# Patient Record
Sex: Female | Born: 1990 | Race: White | Hispanic: No | Marital: Single | Smoking: Never smoker
Health system: Southern US, Community
[De-identification: ages and names within clinical notes are randomized; demographics above are authoritative.]

## PROBLEM LIST (undated history)

## (undated) ENCOUNTER — Inpatient Hospital Stay: Payer: Self-pay

## (undated) DIAGNOSIS — J45909 Unspecified asthma, uncomplicated: Secondary | ICD-10-CM

## (undated) DIAGNOSIS — F419 Anxiety disorder, unspecified: Secondary | ICD-10-CM

## (undated) DIAGNOSIS — Z87448 Personal history of other diseases of urinary system: Secondary | ICD-10-CM

## (undated) HISTORY — DX: Anxiety disorder, unspecified: F41.9

## (undated) HISTORY — PX: WISDOM TOOTH EXTRACTION: SHX21

---
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
## 2011-01-12 ENCOUNTER — Emergency Department: Payer: Self-pay | Admitting: Emergency Medicine

## 2012-08-03 ENCOUNTER — Emergency Department: Payer: Self-pay | Admitting: Emergency Medicine

## 2012-08-03 LAB — URINALYSIS, COMPLETE
Bilirubin,UR: NEGATIVE
Glucose,UR: NEGATIVE mg/dL (ref 0–75)
Nitrite: NEGATIVE
Protein: NEGATIVE
RBC,UR: 3 /HPF (ref 0–5)
Specific Gravity: 1.008 (ref 1.003–1.030)
Squamous Epithelial: 1
WBC UR: 13 /HPF (ref 0–5)

## 2012-08-03 LAB — COMPREHENSIVE METABOLIC PANEL
Albumin: 4.3 g/dL (ref 3.4–5.0)
Alkaline Phosphatase: 83 U/L (ref 50–136)
BUN: 9 mg/dL (ref 7–18)
Calcium, Total: 9.2 mg/dL (ref 8.5–10.1)
Co2: 32 mmol/L (ref 21–32)
Creatinine: 0.64 mg/dL (ref 0.60–1.30)
EGFR (African American): >60
EGFR (Non-African Amer.): >60
Glucose: 89 mg/dL (ref 65–99)
Osmolality: 276 (ref 275–301)
Potassium: 3.9 mmol/L (ref 3.5–5.1)
SGOT(AST): 14 U/L — ABNORMAL LOW (ref 15–37)
Sodium: 139 mmol/L (ref 136–145)
Total Protein: 8.2 g/dL (ref 6.4–8.2)

## 2012-08-03 LAB — LIPASE, BLOOD: Lipase: 178 U/L (ref 73–393)

## 2012-08-03 LAB — WET PREP, GENITAL

## 2012-08-03 LAB — CBC
HCT: 39.1 % (ref 35.0–47.0)
HGB: 13.2 g/dL (ref 12.0–16.0)
MCH: 27.6 pg (ref 26.0–34.0)
MCHC: 33.8 g/dL (ref 32.0–36.0)
Platelet: 362 10*3/uL (ref 150–440)
RDW: 15.6 % — ABNORMAL HIGH (ref 11.5–14.5)
WBC: 11 10*3/uL (ref 3.6–11.0)

## 2012-08-03 LAB — GC/CHLAMYDIA PROBE AMP

## 2015-07-03 ENCOUNTER — Encounter: Payer: Self-pay | Admitting: Emergency Medicine

## 2015-07-03 ENCOUNTER — Emergency Department: Payer: No Typology Code available for payment source

## 2015-07-03 ENCOUNTER — Emergency Department
Admission: EM | Admit: 2015-07-03 | Discharge: 2015-07-03 | Disposition: A | Discharge status: Home / Self Care | Payer: No Typology Code available for payment source | Attending: Emergency Medicine | Admitting: Emergency Medicine

## 2015-07-03 DIAGNOSIS — Y9389 Activity, other specified: Secondary | ICD-10-CM | POA: Insufficient documentation

## 2015-07-03 DIAGNOSIS — T22111A Burn of first degree of right forearm, initial encounter: Secondary | ICD-10-CM | POA: Diagnosis not present

## 2015-07-03 DIAGNOSIS — S161XXA Strain of muscle, fascia and tendon at neck level, initial encounter: Secondary | ICD-10-CM | POA: Diagnosis not present

## 2015-07-03 DIAGNOSIS — S40012A Contusion of left shoulder, initial encounter: Secondary | ICD-10-CM | POA: Diagnosis not present

## 2015-07-03 DIAGNOSIS — Y92411 Interstate highway as the place of occurrence of the external cause: Secondary | ICD-10-CM | POA: Insufficient documentation

## 2015-07-03 DIAGNOSIS — F17200 Nicotine dependence, unspecified, uncomplicated: Secondary | ICD-10-CM | POA: Insufficient documentation

## 2015-07-03 DIAGNOSIS — Y999 Unspecified external cause status: Secondary | ICD-10-CM | POA: Insufficient documentation

## 2015-07-03 DIAGNOSIS — S4992XA Unspecified injury of left shoulder and upper arm, initial encounter: Secondary | ICD-10-CM | POA: Diagnosis present

## 2015-07-03 MED ORDER — SILVER SULFADIAZINE 1 % EX CREA: TOPICAL_CREAM | CUTANEOUS | Status: DC

## 2015-07-03 MED ORDER — IBUPROFEN 800 MG PO TABS: 800.0000 mg | ORAL_TABLET | Freq: Three times a day (TID) | ORAL | Status: DC | PRN

## 2015-07-03 MED ORDER — IBUPROFEN 800 MG PO TABS
800.0000 mg | ORAL_TABLET | Freq: Once | ORAL | Status: AC
  Administered 2015-07-03: 800 mg via ORAL
  Filled 2015-07-03: qty 1

## 2015-07-03 MED ORDER — CYCLOBENZAPRINE HCL 5 MG PO TABS: 5.0000 mg | ORAL_TABLET | Freq: Three times a day (TID) | ORAL | Status: DC | PRN

## 2015-07-03 MED ORDER — CYCLOBENZAPRINE HCL 10 MG PO TABS
10.0000 mg | ORAL_TABLET | Freq: Once | ORAL | Status: AC
  Administered 2015-07-03: 10 mg via ORAL
  Filled 2015-07-03: qty 1
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
## 2015-07-03 NOTE — Discharge Instructions (Signed)
Cervical Sprain A cervical sprain is when the tissues (ligaments) that hold the neck bones in place stretch or tear. HOME CARE   Put ice on the injured area.  Put ice in a plastic bag.  Place a towel between your skin and the bag.  Leave the ice on for 15-20 minutes, 3-4 times a day.  You may have been given a collar to wear. This collar keeps your neck from moving while you heal.  Do not take the collar off unless told by your doctor.  If you have long hair, keep it outside of the collar.  Ask your doctor before changing the position of your collar. You may need to change its position over time to make it more comfortable.  If you are allowed to take off the collar for cleaning or bathing, follow your doctor's instructions on how to do it safely.  Keep your collar clean by wiping it with mild soap and water. Dry it completely. If the collar has removable pads, remove them every 1-2 days to hand wash them with soap and water. Allow them to air dry. They should be dry before you wear them in the collar.  Do not drive while wearing the collar.  Only take medicine as told by your doctor.  Keep all doctor visits as told.  Keep all physical therapy visits as told.  Adjust your work station so that you have good posture while you work.  Avoid positions and activities that make your problems worse.  Warm up and stretch before being active. GET HELP IF:  Your pain is not controlled with medicine.  You cannot take less pain medicine over time as planned.  Your activity level does not improve as expected. GET HELP RIGHT AWAY IF:   You are bleeding.  Your stomach is upset.  You have an allergic reaction to your medicine.  You develop new problems that you cannot explain.  You lose feeling (become numb) or you cannot move any part of your body (paralysis).  You have tingling or weakness in any part of your body.  Your symptoms get worse. Symptoms include:  Pain,  soreness, stiffness, puffiness (swelling), or a burning feeling in your neck.  Pain when your neck is touched.  Shoulder or upper back pain.  Limited ability to move your neck.  Headache.  Dizziness.  Your hands or arms feel week, lose feeling, or tingle.  Muscle spasms.  Difficulty swallowing or chewing. MAKE SURE YOU:   Understand these instructions.  Will watch your condition.  Will get help right away if you are not doing well or get worse.   This information is not intended to replace advice given to you by your health care provider. Make sure you discuss any questions you have with your health care provider.   Document Released: 07/13/2007 Document Revised: 09/26/2012 Document Reviewed: 08/01/2012 Elsevier Interactive Patient Education 2016 [employer].  Burn Care Your skin is a natural barrier to infection. It is the largest organ of your body. Burns damage this natural protection. To help prevent infection, it is very important to follow your caregiver's instructions in the care of your burn. Burns are classified as:  First degree. There is only redness of the skin (erythema). No scarring is expected.  Second degree. There is blistering of the skin. Scarring may occur with deeper burns.  Third degree. All layers of the skin are injured, and scarring is expected. HOME CARE INSTRUCTIONS   Wash your hands well  before changing your bandage.  Change your bandage as often as directed by your caregiver.  Remove the old bandage. If the bandage sticks, you may soak it off with cool, clean water.  Cleanse the burn thoroughly but gently with mild soap and water.  Pat the area dry with a clean, dry cloth.  Apply a thin layer of antibacterial cream to the burn.  Apply a clean bandage as instructed by your caregiver.  Keep the bandage as clean and dry as possible.  Elevate the affected area for the first 24 hours, then as instructed by your caregiver.  Only take  over-the-counter or prescription medicines for pain, discomfort, or fever as directed by your caregiver. SEEK IMMEDIATE MEDICAL CARE IF:   You develop excessive pain.  You develop redness, tenderness, swelling, or red streaks near the burn.  The burned area develops yellowish-white fluid (pus) or a bad smell.  You have a fever. MAKE SURE YOU:   Understand these instructions.  Will watch your condition.  Will get help right away if you are not doing well or get worse.   This information is not intended to replace advice given to you by your health care provider. Make sure you discuss any questions you have with your health care provider.   Document Released: 01/24/2005 Document Revised: 04/18/2011 Document Reviewed: 06/16/2010 Elsevier Interactive Patient Education 2016 [employer] Inc.  Contusion A contusion is a deep bruise. Contusions are the result of a blunt injury to tissues and muscle fibers under the skin. The injury causes bleeding under the skin. The skin overlying the contusion may turn blue, purple, or yellow. Minor injuries will give you a painless contusion, but more severe contusions may stay painful and swollen for a few weeks.  CAUSES  This condition is usually caused by a blow, trauma, or direct force to an area of the body. SYMPTOMS  Symptoms of this condition include:  Swelling of the injured area.  Pain and tenderness in the injured area.  Discoloration. The area may have redness and then turn blue, purple, or yellow. DIAGNOSIS  This condition is diagnosed based on a physical exam and medical history. An X-ray, CT scan, or MRI may be needed to determine if there are any associated injuries, such as broken bones (fractures). TREATMENT  Specific treatment for this condition depends on what area of the body was injured. In general, the best treatment for a contusion is resting, icing, applying pressure to (compression), and elevating the injured area. This is  often called the RICE strategy. Over-the-counter anti-inflammatory medicines may also be recommended for pain control.  HOME CARE INSTRUCTIONS   Rest the injured area.  If directed, apply ice to the injured area:  Put ice in a plastic bag.  Place a towel between your skin and the bag.  Leave the ice on for 20 minutes, 2-3 times per day.  If directed, apply light compression to the injured area using an elastic bandage. Make sure the bandage is not wrapped too tightly. Remove and reapply the bandage as directed by your health care provider.  If possible, raise (elevate) the injured area above the level of your heart while you are sitting or lying down.  Take over-the-counter and prescription medicines only as told by your health care provider. SEEK MEDICAL CARE IF:  Your symptoms do not improve after several days of treatment.  Your symptoms get worse.  You have difficulty moving the injured area. SEEK IMMEDIATE MEDICAL CARE IF:   You  have severe pain.  You have numbness in a hand or foot.  Your hand or foot turns pale or cold.   This information is not intended to replace advice given to you by your health care provider. Make sure you discuss any questions you have with your health care provider.   Document Released: 11/03/2004 Document Revised: 10/15/2014 Document Reviewed: 06/11/2014 Elsevier Interactive Patient Education 2016 Elsevier Inc.  Cryotherapy Cryotherapy means treatment with cold. Ice or gel packs can be used to reduce both pain and swelling. Ice is the most helpful within the first 24 to 48 hours after an injury or flare-up from overusing a muscle or joint. Sprains, strains, spasms, burning pain, shooting pain, and aches can all be eased with ice. Ice can also be used when recovering from surgery. Ice is effective, has very few side effects, and is safe for most people to use. PRECAUTIONS  Ice is not a safe treatment option for people with:  Raynaud  phenomenon. This is a condition affecting small blood vessels in the extremities. Exposure to cold may cause your problems to return.  Cold hypersensitivity. There are many forms of cold hypersensitivity, including:  Cold urticaria. Red, itchy hives appear on the skin when the tissues begin to warm after being iced.  Cold erythema. This is a red, itchy rash caused by exposure to cold.  Cold hemoglobinuria. Red blood cells break down when the tissues begin to warm after being iced. The hemoglobin that carry oxygen are passed into the urine because they cannot combine with blood proteins fast enough.  Numbness or altered sensitivity in the area being iced. If you have any of the following conditions, do not use ice until you have discussed cryotherapy with your caregiver:  Heart conditions, such as arrhythmia, angina, or chronic heart disease.  High blood pressure.  Healing wounds or open skin in the area being iced.  Current infections.  Rheumatoid arthritis.  Poor circulation.  Diabetes. Ice slows the blood flow in the region it is applied. This is beneficial when trying to stop inflamed tissues from spreading irritating chemicals to surrounding tissues. However, if you expose your skin to cold temperatures for too long or without the proper protection, you can damage your skin or nerves. Watch for signs of skin damage due to cold. HOME CARE INSTRUCTIONS Follow these tips to use ice and cold packs safely.  Place a dry or damp towel between the ice and skin. A damp towel will cool the skin more quickly, so you may need to shorten the time that the ice is used.  For a more rapid response, add gentle compression to the ice.  Ice for no more than 10 to 20 minutes at a time. The bonier the area you are icing, the less time it will take to get the benefits of ice.  Check your skin after 5 minutes to make sure there are no signs of a poor response to cold or skin damage.  Rest 20  minutes or more between uses.  Once your skin is numb, you can end your treatment. You can test numbness by very lightly touching your skin. The touch should be so light that you do not see the skin dimple from the pressure of your fingertip. When using ice, most people will feel these normal sensations in this order: cold, burning, aching, and numbness.  Do not use ice on someone who cannot communicate their responses to pain, such as small children or people with dementia.  HOW TO MAKE AN ICE PACK Ice packs are the most common way to use ice therapy. Other methods include ice massage, ice baths, and cryosprays. Muscle creams that cause a cold, tingly feeling do not offer the same benefits that ice offers and should not be used as a substitute unless recommended by your caregiver. To make an ice pack, do one of the following:  Place crushed ice or a bag of frozen vegetables in a sealable plastic bag. Squeeze out the excess air. Place this bag inside another plastic bag. Slide the bag into a pillowcase or place a damp towel between your skin and the bag.  Mix 3 parts water with 1 part rubbing alcohol. Freeze the mixture in a sealable plastic bag. When you remove the mixture from the freezer, it will be slushy. Squeeze out the excess air. Place this bag inside another plastic bag. Slide the bag into a pillowcase or place a damp towel between your skin and the bag. SEEK MEDICAL CARE IF:  You develop white spots on your skin. This may give the skin a blotchy (mottled) appearance.  Your skin turns blue or pale.  Your skin becomes waxy or hard.  Your swelling gets worse. MAKE SURE YOU:   Understand these instructions.  Will watch your condition.  Will get help right away if you are not doing well or get worse.   This information is not intended to replace advice given to you by your health care provider. Make sure you discuss any questions you have with your health care provider.   Document  Released: 09/20/2010 Document Revised: 02/14/2014 Document Reviewed: 09/20/2010 Elsevier Interactive Patient Education [employer] Elsevier Inc.
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
## 2016-01-26 ENCOUNTER — Emergency Department
Admission: EM | Admit: 2016-01-26 | Discharge: 2016-01-26 | Disposition: A | Discharge status: Home / Self Care | Payer: Self-pay | Attending: Emergency Medicine | Admitting: Emergency Medicine

## 2016-01-26 ENCOUNTER — Encounter: Payer: Self-pay | Admitting: Emergency Medicine

## 2016-01-26 DIAGNOSIS — J039 Acute tonsillitis, unspecified: Secondary | ICD-10-CM

## 2016-01-26 DIAGNOSIS — F17200 Nicotine dependence, unspecified, uncomplicated: Secondary | ICD-10-CM | POA: Insufficient documentation

## 2016-01-26 LAB — CBC WITH DIFFERENTIAL/PLATELET
BASOS ABS: 0 10*3/uL (ref 0–0.1)
BASOS PCT: 0 %
EOS ABS: 0.2 10*3/uL (ref 0–0.7)
Eosinophils Relative: 2 %
HEMATOCRIT: 40.1 % (ref 35.0–47.0)
HEMOGLOBIN: 13.9 g/dL (ref 12.0–16.0)
Lymphocytes Relative: 11 %
Lymphs Abs: 1.3 10*3/uL (ref 1.0–3.6)
MCH: 30.3 pg (ref 26.0–34.0)
MCHC: 34.6 g/dL (ref 32.0–36.0)
MCV: 87.6 fL (ref 80.0–100.0)
Monocytes Absolute: 0.6 10*3/uL (ref 0.2–0.9)
Monocytes Relative: 5 %
NEUTROS ABS: 10.1 10*3/uL — AB (ref 1.4–6.5)
NEUTROS PCT: 82 %
Platelets: 275 10*3/uL (ref 150–440)
RBC: 4.58 MIL/uL (ref 3.80–5.20)
RDW: 13 % (ref 11.5–14.5)
WBC: 12.2 10*3/uL — AB (ref 3.6–11.0)

## 2016-01-26 LAB — URINALYSIS, COMPLETE (UACMP) WITH MICROSCOPIC
BILIRUBIN URINE: NEGATIVE
Bacteria, UA: NONE SEEN
GLUCOSE, UA: NEGATIVE mg/dL
Hgb urine dipstick: NEGATIVE
KETONES UR: NEGATIVE mg/dL
Leukocytes, UA: NEGATIVE
NITRITE: NEGATIVE
PH: 9 — AB (ref 5.0–8.0)
Protein, ur: 30 mg/dL — AB
SPECIFIC GRAVITY, URINE: 1.018 (ref 1.005–1.030)

## 2016-01-26 LAB — POCT PREGNANCY, URINE: Preg Test, Ur: NEGATIVE

## 2016-01-26 LAB — POCT RAPID STREP A: STREPTOCOCCUS, GROUP A SCREEN (DIRECT): NEGATIVE

## 2016-01-26 LAB — MONONUCLEOSIS SCREEN: MONO SCREEN: NEGATIVE

## 2016-01-26 MED ORDER — AZITHROMYCIN 250 MG PO TABS: ORAL_TABLET | ORAL | 0 refills | Status: DC

## 2016-01-26 NOTE — Discharge Instructions (Signed)
Take medicine until completely finished beginning today. Take Tylenol or ibuprofen as needed for throat pain. Increase fluids. Follow-up with Dr. Jenne CampusMcQueen if any continued problems with your  throat.

## 2016-01-26 NOTE — ED Notes (Signed)
AAOx3.  Skin warm and dry. NAD.  No SOB/ DOE. 

## 2016-01-26 NOTE — ED Triage Notes (Signed)
Presents this am with sore throat and increased pain with swallowing

## 2016-01-26 NOTE — ED Provider Notes (Signed)
[facility] Emergency Department Provider Note  ____________________________________________   First MD Initiated Contact with Patient 01/26/16 1333     (approximate)  I have reviewed the triage vital signs and the nursing notes.   HISTORY  Chief Complaint Sore Throat   HPI Lindsey Deleon is a 25 y.o. female is here with complaints with throat that began this morning and has increased in pain with swallowing. Patient is unaware of any fever. She states she's not been exposed to strep throat as far she knows. Patient is not taking any over-the-counter medication prior to arrival in the emergency room. She rates her pain as 6/10.   History reviewed. No pertinent past medical history.  There are no active problems to display for this patient.   No past surgical history on file.  Prior to Admission medications   Medication Sig Start Date End Date Taking? Authorizing Provider  azithromycin (ZITHROMAX Z-PAK) 250 MG tablet Take 2 tablets (500 mg) on  Day 1,  followed by 1 tablet (250 mg) once daily on Days 2 through 5. 01/26/16   Tommi Rumpshonda L Summers, PA-C    Allergies Patient has no known allergies.  No family history on file.  Social History Social History  Substance Use Topics  . Smoking status: Current Every Day Smoker  . Smokeless tobacco: Never Used  . Alcohol use No    Review of Systems Constitutional: No fever/chills Eyes: No visual changes. ENT: Positive sore throat. Cardiovascular: Denies chest pain. Respiratory: Denies shortness of breath. Gastrointestinal: No abdominal pain.  No nausea, no vomiting.   Musculoskeletal: Negative for back pain. Skin: Negative for rash. Neurological: Negative for headaches, focal weakness or numbness.  10-point ROS otherwise negative.  ____________________________________________   PHYSICAL EXAM:  VITAL SIGNS: ED Triage Vitals  Enc Vitals Group     BP 01/26/16 1315 123/66     Pulse Rate  01/26/16 1315 78     Resp 01/26/16 1315 20     Temp 01/26/16 1315 98.4 F (36.9 C)     Temp Source 01/26/16 1315 Oral     SpO2 01/26/16 1315 100 %     Weight 01/26/16 1315 140 lb (63.5 kg)     Height 01/26/16 1315 5\' 4"  (1.626 m)     Head Circumference --      Peak Flow --      Pain Score 01/26/16 1259 6     Pain Loc --      Pain Edu? --      Excl. in GC? --     Constitutional: Alert and oriented. Well appearing and in no acute distress. Eyes: Conjunctivae are normal. PERRL. EOMI. Head: Atraumatic. Nose: No congestion/rhinnorhea. Mouth/Throat: Mucous membranes are moist.  Oropharynx With exudative tonsils bilaterally. Minimal erythema was noted. Neck: No stridor.  Hematological/Lymphatic/Immunilogical: No cervical lymphadenopathy. Cardiovascular: Normal rate, regular rhythm. Grossly normal heart sounds.  Good peripheral circulation. Respiratory: Normal respiratory effort.  No retractions. Lungs CTAB. Gastrointestinal: Soft and nontender. No distention.  Musculoskeletal: No lower extremity tenderness nor edema.  No joint effusions. Neurologic:  Normal speech and language. No gross focal neurologic deficits are appreciated. No gait instability. Skin:  Skin is warm, dry and intact. No rash noted. Psychiatric: Mood and affect are normal. Speech and behavior are normal.  ____________________________________________   LABS (all labs ordered are listed, but only abnormal results are displayed)  Labs Reviewed  CBC WITH DIFFERENTIAL/PLATELET - Abnormal; Notable for the following:  Result Value   WBC 12.2 (*)    Neutro Abs 10.1 (*)    All other components within normal limits  URINALYSIS, COMPLETE (UACMP) WITH MICROSCOPIC - Abnormal; Notable for the following:    Color, Urine YELLOW (*)    APPearance TURBID (*)    pH 9.0 (*)    Protein, ur 30 (*)    Squamous Epithelial / LPF 6-30 (*)    All other components within normal limits  CULTURE, GROUP A STREP [facility]    MONONUCLEOSIS SCREEN  POCT RAPID STREP A  POC URINE PREG, ED  POCT PREGNANCY, URINE   ____________________________________________  PROCEDURES  Procedure(s) performed: None  Procedures  Critical Care performed: No  ____________________________________________   INITIAL IMPRESSION / ASSESSMENT AND PLAN / ED COURSE  Pertinent labs & imaging results that were available during my care of the patient were reviewed by me and considered in my medical decision making (see chart for details).    Clinical Course    Patient was made aware that her strep test and mono were negative. Patient will be treated for exudative tonsillitis and she was given a prescription for Zithromax. She is also encouraged to take Tylenol or ibuprofen as needed for throat pain. She'll increase fluids. She'll follow up with Dr. Jenne CampusMcQueen if any continued problems with her throat.  ____________________________________________   FINAL CLINICAL IMPRESSION(S) / ED DIAGNOSES  Final diagnoses:  Exudative tonsillitis  Tonsillitis      NEW MEDICATIONS STARTED DURING THIS VISIT:  Discharge Medication List as of 01/26/2016  3:50 PM    START taking these medications   Details  azithromycin (ZITHROMAX Z-PAK) 250 MG tablet Take 2 tablets (500 mg) on  Day 1,  followed by 1 tablet (250 mg) once daily on Days 2 through 5., Print         Note:  This document was prepared using Dragon voice recognition software and may include unintentional dictation errors.    Tommi RumpsRhonda L Summers, PA-C 01/26/16 1906    Charlynne Panderavid Hsienta Yao, MD 01/28/16 [phone]

## 2016-01-27 LAB — CULTURE, GROUP A STREP (THRC)

## 2016-01-28 NOTE — Progress Notes (Addendum)
ED Antimicrobial Stewardship Positive Culture Follow Up   Lindsey Deleon is an 25 y.o. female who [facility] to [facility] with a chief complaint of  Chief Complaint  Patient presents with  . Sore Throat    Recent Results (from the past 720 hour(s))  Culture, group A strep     Status: None   Collection Time: 01/26/16 12:57 PM  Result Value Ref Range Status   Specimen Description THROAT  Final   Special Requests NONE  Final   Culture ABUNDANT GROUP A STREP (S.PYOGENES) ISOLATED  Final   Report Status 01/27/2016 FINAL  Final    Throat culture resulted growing group A strep. Patient was discharged on azithromycin which will likely cover. Per discussion with MD - will ask patient how they are feeling and if not improving will call in a prescription for amoxicillin.  New antibiotic prescription: amoxicillin 500 mg PO BID x 10 days  ED Provider: Dr. Don PerkingVeronese  12/21 - Called and left voicemail asking patient to return phone call to pharmacy.   Cindi CarbonMary M Reinhold Rickey, PharmD Clinical Pharmacist 01/28/2016, 3:16 PM  Addendum:  12/22 - 2nd attempt to contact patient. Left another voicemail requesting return phone call to [facility] pharmacy.  Cindi CarbonMary M Raimundo Corbit, PharmD Clinical Pharmacist 01/29/16 12:10 PM

## 2016-02-08 NOTE — L&D Delivery Note (Signed)
Delivery Note, + drainage of a Skene's gland cyst  At 10:40 PM a viable and healthy female "Lindsey Deleon" was delivered via Vaginal, Spontaneous Delivery (Presentation:ROA).  APGAR:8 ,9 ; weight 9 lb 2.7 oz (4160 g).   Placenta status: expressed.  Cord: 3VC with the following complications: none  Anesthesia:  epidural Episiotomy:  none Lacerations: 3rd degree Suture Repair: 2.0 3.0 vicryl vicryl rapide Est. Blood Loss (mL):  200  Mom to postpartum.  Baby to Couplet care / Skin to Skin.  Pt admitted from the office with a plan for iol for suspected LGA. She was contracting on arrival. With cytotec x1 buccally and foley bulb, then AROM for clear fluid and pitocin at a max of 8 mu/min, pt progressed to fully dilated. She pushed over an intact perineum with moderate pain control and excellent effort. She delivered the fetal head followed promptly by the fetal shoulders. The baby placed on maternal abdomen.  At full dilation, the skene's gland cyst was noted to be obstructing the introitus. This was cleaned with iodine and a 22 gauge needle used to withdrawal 4ml of cloudly white/yellow fluid to decompress for delivery.  Evaluation of the perineum found a 50% thickness sphincter 3rd degree tear.  Adequate anesthesia was assured prior to the start of the procedure. A multi-layer closure of the deep vaginal lac was planned.   The external sphincter was grasped with two long Allis clamps and brought to the midline. It was closed in an end-to-end fashion with 2-0 Vicryl in multiple interrupted stiches. It came together without excess tissues tension.  The rectovaginal septum and perineal body were brought together in running layers, adding in a crown stitch to bring the deep edges of the bulbocavernosus together in the midline.   The vaginal mucosa was repaired with 2-0 Vicryl Rapide in a running locked fashion, and the perineal edges were brought together in the midline in a subcuticular fashion.The last  knot was buried behind the hymen.   After conclusion of the repair, the skene's gland was cleaned with iodine again, and a 3mm incision made with a 10-blade and the gland fully drained. The incision was opened to try to allow for drainage and instructions about cleanliness given.  The patient tolerated the procedure well and is stable in the labor and delivery room.  Lindsey DouglasBethany Makinsley Deleon 10/13/2016, 11:25 PM

## 2016-09-22 ENCOUNTER — Other Ambulatory Visit: Payer: Self-pay | Admitting: Obstetrics and Gynecology

## 2016-09-22 DIAGNOSIS — Z3689 Encounter for other specified antenatal screening: Secondary | ICD-10-CM

## 2016-09-27 ENCOUNTER — Ambulatory Visit (INDEPENDENT_AMBULATORY_CARE_PROVIDER_SITE_OTHER): Payer: Medicaid Other | Admitting: Urology

## 2016-09-27 ENCOUNTER — Encounter: Payer: Self-pay | Admitting: Urology

## 2016-09-27 VITALS — BP 102/61 | HR 83 | Ht 64.0 in | Wt 172.2 lb

## 2016-09-27 DIAGNOSIS — N898 Other specified noninflammatory disorders of vagina: Secondary | ICD-10-CM | POA: Diagnosis not present

## 2016-09-27 LAB — URINALYSIS, COMPLETE
BILIRUBIN UA: NEGATIVE
Glucose, UA: NEGATIVE
Ketones, UA: NEGATIVE
Leukocytes, UA: NEGATIVE
NITRITE UA: NEGATIVE
PH UA: 7 (ref 5.0–7.5)
Protein, UA: NEGATIVE
RBC UA: NEGATIVE
Specific Gravity, UA: 1.015 (ref 1.005–1.030)
UUROB: 0.2 mg/dL (ref 0.2–1.0)

## 2016-09-27 LAB — MICROSCOPIC EXAMINATION
BACTERIA UA: NONE SEEN
RBC MICROSCOPIC, UA: NONE SEEN /HPF (ref 0–?)
WBC UA: NONE SEEN /HPF (ref 0–?)

## 2016-09-27 NOTE — Progress Notes (Signed)
09/27/2016 1:35 PM   Lindsey Deleon [birth year] [id number]  Referring provider: No referring provider defined for this encounter.  Chief Complaint  Patient presents with  . New Patient (Initial Visit)    Uretheral mass    HPI: 26 year old white female who is stable with IUP at 36 weeks referred over for urethral mass. She was seen by her obstetrician Dr. Feliberto Deleon 09/21/2016 and the mass was noted on exam. Pt thinks this was first noted a few months ago during early pregnancy. It is getting bigger, maybe pressure from the baby. Her UA was clear. She voids with a good flow. Feel empty. Pt can see it but she can't feel it and it doesn't bother her. She has frequency since being pregnant but none at baseline. No incontinence.  Looking back, Lindsey Deleon has no GU hx as a child nor any UTI. No GU surgery.    PMH: No past medical history on file.  Surgical History: No past surgical history on file.  Home Medications:  Allergies as of 09/27/2016   No Known Allergies     Medication List       Accurate as of 09/27/16  1:35 PM. Always use your most recent med list.          DOCOSAHEXAENOIC ACID PO Take by mouth.       Allergies: No Known Allergies  Family History: No family history on file.  Social History:  reports that she has been smoking.  She has never used smokeless tobacco. She reports that she does not drink alcohol. Her drug history is not on file.  ROS:                                        Physical Exam: There were no vitals taken for this visit.  Chaperone: Lindsey Deleon  Constitutional:  Alert and oriented, No acute distress. HEENT: [place] AT, moist mucus membranes.  Trachea midline, no masses. Cardiovascular: No clubbing, cyanosis, or edema. Respiratory: Normal respiratory effort, no increased work of breathing. GI: Abdomen is soft, nontender, nondistended, no abdominal masses GU: No CVA tenderness.  Pelvic: Vulva normal, there is a cystic  mass 5 o'clock just inferior to meatus at anterior vaginal wall. Meatus above looks normal. Pushing on the mass does not produce drainage or purulent fluid. Skin: No rashes, bruises or suspicious lesions. Lymph: No cervical or inguinal adenopathy. Neurologic: Grossly intact, no focal deficits, moving all 4 extremities. Psychiatric: Normal mood and affect.  Laboratory Data: Lab Results  Component Value Date   WBC 12.2 (H) 01/26/2016   HGB 13.9 01/26/2016   HCT 40.1 01/26/2016   MCV 87.6 01/26/2016   PLT 275 01/26/2016    Lab Results  Component Value Date   CREATININE 0.64 08/03/2012    No results found for: PSA  No results found for: TESTOSTERONE  No results found for: HGBA1C  Urinalysis    Component Value Date/Time   COLORURINE YELLOW (A) 01/26/2016 1418   APPEARANCEUR TURBID (A) 01/26/2016 1418   APPEARANCEUR Clear 08/03/2012 1220   LABSPEC 1.018 01/26/2016 1418   LABSPEC 1.008 08/03/2012 1220   PHURINE 9.0 (H) 01/26/2016 1418   GLUCOSEU NEGATIVE 01/26/2016 1418   GLUCOSEU Negative 08/03/2012 1220   HGBUR NEGATIVE 01/26/2016 1418   BILIRUBINUR NEGATIVE 01/26/2016 1418   BILIRUBINUR Negative 08/03/2012 1220   KETONESUR NEGATIVE 01/26/2016 1418   PROTEINUR 30 (A) 01/26/2016 1418  NITRITE NEGATIVE 01/26/2016 1418   LEUKOCYTESUR NEGATIVE 01/26/2016 1418   LEUKOCYTESUR 1+ 08/03/2012 1220     Assessment & Plan:  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 Will set up for pelvic MRI and consider TV US with a renal bladder US.   There are no diagnoses linked to this encounter.  No Follow-up on file.  Lindsey Field, MD  [facility] [address] [place], [place] [address] [phone]

## 2016-09-28 ENCOUNTER — Telehealth: Payer: Self-pay

## 2016-09-28 NOTE — Telephone Encounter (Signed)
Jerilee Field, MD  Skeet Latch, LPN        Please send today's note to referring provider in [facility] - thanks!    Notes were printed and faxed to Dr. Feliberto Gottron. Also made note to [facility] clinic BUA notes can be seen in EPIC.

## 2016-09-29 ENCOUNTER — Ambulatory Visit: Payer: No Typology Code available for payment source

## 2016-09-30 ENCOUNTER — Observation Stay
Admission: EM | Admit: 2016-09-30 | Discharge: 2016-09-30 | Disposition: A | Discharge status: Home / Self Care | Payer: Medicaid Other | Attending: Obstetrics and Gynecology | Admitting: Obstetrics and Gynecology

## 2016-09-30 ENCOUNTER — Ambulatory Visit
Admission: RE | Admit: 2016-09-30 | Discharge: 2016-09-30 | Disposition: A | Discharge status: Home / Self Care | Payer: Medicaid Other | Source: Ambulatory Visit | Attending: Urology | Admitting: Urology

## 2016-09-30 DIAGNOSIS — N898 Other specified noninflammatory disorders of vagina: Secondary | ICD-10-CM

## 2016-09-30 DIAGNOSIS — Z3A00 Weeks of gestation of pregnancy not specified: Secondary | ICD-10-CM | POA: Insufficient documentation

## 2016-09-30 DIAGNOSIS — Z3A38 38 weeks gestation of pregnancy: Secondary | ICD-10-CM | POA: Insufficient documentation

## 2016-09-30 DIAGNOSIS — R109 Unspecified abdominal pain: Secondary | ICD-10-CM | POA: Insufficient documentation

## 2016-09-30 DIAGNOSIS — Z87448 Personal history of other diseases of urinary system: Secondary | ICD-10-CM

## 2016-09-30 DIAGNOSIS — O26893 Other specified pregnancy related conditions, third trimester: Secondary | ICD-10-CM

## 2016-09-30 DIAGNOSIS — R0602 Shortness of breath: Secondary | ICD-10-CM | POA: Diagnosis not present

## 2016-09-30 DIAGNOSIS — Z3490 Encounter for supervision of normal pregnancy, unspecified, unspecified trimester: Secondary | ICD-10-CM

## 2016-09-30 HISTORY — DX: Personal history of other diseases of urinary system: Z87.448

## 2016-09-30 HISTORY — DX: Unspecified asthma, uncomplicated: J45.909

## 2016-09-30 MED ORDER — TERBUTALINE SULFATE 1 MG/ML IJ SOLN
INTRAMUSCULAR | Status: AC
  Filled 2016-09-30: qty 1

## 2016-09-30 MED ORDER — TERBUTALINE SULFATE 1 MG/ML IJ SOLN
0.2500 mg | Freq: Once | INTRAMUSCULAR | Status: AC
  Administered 2016-09-30: 0.25 mg via SUBCUTANEOUS

## 2016-09-30 NOTE — OB Triage Note (Signed)
Pt presents to [facility] with abdominal pain in the mid upper abdomen, shortness of breath and  "occasional cramping" in the mid lower abdomen beginning at 1600 today. She is feeling baby move, denies vaginal bleeding and LOF. VSS. Monitors applied and assessing.

## 2016-10-02 ENCOUNTER — Observation Stay
Admission: EM | Admit: 2016-10-02 | Discharge: 2016-10-02 | Disposition: A | Discharge status: Home / Self Care | Payer: Medicaid Other | Attending: Obstetrics and Gynecology | Admitting: Obstetrics and Gynecology

## 2016-10-02 ENCOUNTER — Encounter: Payer: Self-pay | Admitting: Obstetrics and Gynecology

## 2016-10-02 DIAGNOSIS — Z3A00 Weeks of gestation of pregnancy not specified: Secondary | ICD-10-CM | POA: Insufficient documentation

## 2016-10-02 DIAGNOSIS — O479 False labor, unspecified: Secondary | ICD-10-CM | POA: Diagnosis present

## 2016-10-02 DIAGNOSIS — Z3490 Encounter for supervision of normal pregnancy, unspecified, unspecified trimester: Secondary | ICD-10-CM

## 2016-10-02 NOTE — Progress Notes (Signed)
MD Schermerhorn notified via phone of pt presenting with c/o leaking of fluid, negative nitrazine, SVE, EFM reactive with no contractions. Order received for discharge, follow-up tomorrow with appointment.

## 2016-10-02 NOTE — Progress Notes (Signed)
Patient ID: Lindsey Deleon, female   DOB: Sep 06, 1990, 26 y.o.   MRN: [id number] Pt presented to L+D with rare ctx  cx check 1-2 cm .  Reassuring fetal monitoring  D/c home

## 2016-10-02 NOTE — Discharge Instructions (Signed)
Please call or return to [facility] with -Decreased fetal movement -Vaginal bleeding -Contractions every 5 mins for 1 hour -Leaking of fluid

## 2016-10-02 NOTE — Discharge Summary (Signed)
  Vinayak Bobier, Ihor Austin, MD  Obstetrics    [] Hide copied text [] Hover for attribution information Patient ID: Lindsey Deleon, female   DOB: 1990/10/07, 26 y.o.   MRN: [id number] Pt presented to L+D with rare ctx  cx check 1-2 cm .  Reassuring fetal monitoring  D/c home     Electronically signed by Suzy Bouchard, MD at 10/02/2016 9:42 AM

## 2016-10-02 NOTE — OB Triage Note (Signed)
Pt presents to L&D with c/o leaking clear fluid, "enough to wet underware but not through to pants." Pt denies vaginal bleeding, decreased fetal movement, and contractions. Pt with c/o intermittent upper abdominal pain. EFM and toco applied, explained to patient. Will monitor maternal and fetal well-being.

## 2016-10-03 ENCOUNTER — Ambulatory Visit: Payer: Medicaid Other | Admitting: Urology

## 2016-10-03 ENCOUNTER — Telehealth: Payer: Self-pay

## 2016-10-03 NOTE — Telephone Encounter (Signed)
Lindsey Field, MD  Skeet Latch, LPN        Notify patient her pelvic MRI showed a cyst near the urethra and it looks benign. Her OB could safely excise as planned from a GU point of view. I'll send Dr. Feliberto Gottron a note on my end.    Spoke with pt in reference to MRI results and having cyst excised. Pt stated that she was at the [facility] right now and will make them aware. Made pt aware BUA notes were faxed to Dr. Feliberto Gottron but could also be seen on care everywhere. Pt voiced understanding.

## 2016-10-04 NOTE — Progress Notes (Signed)
Patient ID: Lindsey Deleon, female   DOB: April 13, 1990, 26 y.o.   MRN: [id number] Pt c/o LOF  RN eval with negative nitrazine  No ctx Reassuring fetal monitoring  D/c home

## 2016-10-04 NOTE — Discharge Summary (Signed)
  [name], Ihor Austin, MD  Obstetrics    [] Hide copied text [] Hover for attribution information Patient ID: Lindsey Deleon, female   DOB: 13-Jun-1990, 26 y.o.   MRN: [id number] Pt c/o LOF  RN eval with negative nitrazine  No ctx Reassuring fetal monitoring  D/c home    Electronically signed by Suzy Bouchard, MD at 10/04/2016 8:26 AM

## 2016-10-08 ENCOUNTER — Observation Stay
Admission: EM | Admit: 2016-10-08 | Discharge: 2016-10-08 | Disposition: A | Discharge status: Home / Self Care | Payer: Medicaid Other | Attending: Obstetrics & Gynecology | Admitting: Obstetrics & Gynecology

## 2016-10-08 DIAGNOSIS — Z3A38 38 weeks gestation of pregnancy: Secondary | ICD-10-CM | POA: Diagnosis not present

## 2016-10-08 DIAGNOSIS — O471 False labor at or after 37 completed weeks of gestation: Secondary | ICD-10-CM | POA: Diagnosis present

## 2016-10-08 NOTE — Discharge Instructions (Signed)
Return to hospital for contractions that are closer together and stronger, for vaginal bleeding, leaking of fluid, or decreased fetal movement.

## 2016-10-08 NOTE — OB Triage Note (Signed)
Pt arrived in triage with c/o ctxs on and off for a couple weeks. Presents today with "some" contractions that are "not as strong" as they were last night. Denies recent LOF or VB. Reports some fetal movement but decreased recently. Discussed plan of care. Patient verbalize understanding.

## 2016-10-08 NOTE — OB Triage Note (Signed)
Discharge instructions provided and reviewed.  Follow-up care addressed.   All questions answered.

## 2016-10-10 NOTE — Discharge Summary (Signed)
Patient's last menstrual period was 01/19/2016. EDC: 10/08/2016 EGA: 8479w3d  Patient presented for evaluation of labor.  Patient had cervical exam by RN and this was reported to me. I reviewed her vital signs and fetal tracing, both of which were reassuring.  Patient was discharged as she was not laboring.  NST interpretation: Reactive.  Ranae Plumberhelsea Donnivan Villena, MD Attending Obstetrician and Gynecologist [facility]

## 2016-10-13 ENCOUNTER — Encounter: Payer: Self-pay | Admitting: *Deleted

## 2016-10-13 ENCOUNTER — Inpatient Hospital Stay: Payer: Medicaid Other | Admitting: Anesthesiology

## 2016-10-13 ENCOUNTER — Inpatient Hospital Stay
Admission: EM | Admit: 2016-10-13 | Discharge: 2016-10-15 | DRG: 775 | Disposition: A | Discharge status: Home / Self Care | Payer: Medicaid Other | Attending: Obstetrics and Gynecology | Admitting: Obstetrics and Gynecology

## 2016-10-13 DIAGNOSIS — N368 Other specified disorders of urethra: Secondary | ICD-10-CM | POA: Diagnosis present

## 2016-10-13 DIAGNOSIS — Z3A39 39 weeks gestation of pregnancy: Secondary | ICD-10-CM | POA: Diagnosis not present

## 2016-10-13 DIAGNOSIS — O3660X0 Maternal care for excessive fetal growth, unspecified trimester, not applicable or unspecified: Secondary | ICD-10-CM | POA: Diagnosis present

## 2016-10-13 DIAGNOSIS — O3663X0 Maternal care for excessive fetal growth, third trimester, not applicable or unspecified: Principal | ICD-10-CM | POA: Diagnosis present

## 2016-10-13 LAB — CBC
HEMATOCRIT: 35.3 % (ref 35.0–47.0)
Hemoglobin: 12.6 g/dL (ref 12.0–16.0)
MCH: 31.3 pg (ref 26.0–34.0)
MCHC: 35.7 g/dL (ref 32.0–36.0)
MCV: 87.8 fL (ref 80.0–100.0)
PLATELETS: 186 10*3/uL (ref 150–440)
RBC: 4.02 MIL/uL (ref 3.80–5.20)
RDW: 13.5 % (ref 11.5–14.5)
WBC: 8 10*3/uL (ref 3.6–11.0)

## 2016-10-13 LAB — TYPE AND SCREEN
ABO/RH(D): A POS
Antibody Screen: NEGATIVE

## 2016-10-13 LAB — URINE DRUG SCREEN, QUALITATIVE (ARMC ONLY)
AMPHETAMINES, UR SCREEN: NOT DETECTED
Barbiturates, Ur Screen: NOT DETECTED
Benzodiazepine, Ur Scrn: NOT DETECTED
CANNABINOID 50 NG, UR ~~LOC~~: NOT DETECTED
Cocaine Metabolite,Ur ~~LOC~~: NOT DETECTED
MDMA (ECSTASY) UR SCREEN: NOT DETECTED
Methadone Scn, Ur: NOT DETECTED
OPIATE, UR SCREEN: NOT DETECTED
Phencyclidine (PCP) Ur S: NOT DETECTED
Tricyclic, Ur Screen: NOT DETECTED

## 2016-10-13 MED ORDER — LACTATED RINGERS IV SOLN
INTRAVENOUS | Status: DC
  Administered 2016-10-13: 11:00:00 via INTRAVENOUS

## 2016-10-13 MED ORDER — LIDOCAINE HCL (PF) 2 % IJ SOLN
INTRAMUSCULAR | Status: DC | PRN
  Administered 2016-10-13: 5 mL via INTRADERMAL

## 2016-10-13 MED ORDER — LIDOCAINE HCL (PF) 1 % IJ SOLN: 30.0000 mL | INTRAMUSCULAR | Status: DC | PRN

## 2016-10-13 MED ORDER — OXYTOCIN BOLUS FROM INFUSION: 500.0000 mL | Freq: Once | INTRAVENOUS | Status: DC

## 2016-10-13 MED ORDER — PHENYLEPHRINE 40 MCG/ML (10ML) SYRINGE FOR IV PUSH (FOR BLOOD PRESSURE SUPPORT)
80.0000 ug | PREFILLED_SYRINGE | INTRAVENOUS | Status: DC | PRN
  Filled 2016-10-13: qty 5

## 2016-10-13 MED ORDER — SOD CITRATE-CITRIC ACID 500-334 MG/5ML PO SOLN
30.0000 mL | ORAL | Status: DC | PRN
  Administered 2016-10-13: 30 mL via ORAL
  Filled 2016-10-13: qty 15

## 2016-10-13 MED ORDER — LACTATED RINGERS IV SOLN: 500.0000 mL | Freq: Once | INTRAVENOUS | Status: DC

## 2016-10-13 MED ORDER — LACTATED RINGERS IV SOLN
500.0000 mL | INTRAVENOUS | Status: DC | PRN
  Administered 2016-10-13: 500 mL via INTRAVENOUS

## 2016-10-13 MED ORDER — BENZOCAINE-MENTHOL 20-0.5 % EX AERO
1.0000 "application " | INHALATION_SPRAY | CUTANEOUS | Status: DC | PRN
  Administered 2016-10-14: 1 via TOPICAL
  Filled 2016-10-13: qty 56

## 2016-10-13 MED ORDER — FENTANYL 2.5 MCG/ML W/ROPIVACAINE 0.15% IN NS 100 ML EPIDURAL (ARMC)
EPIDURAL | Status: AC
  Filled 2016-10-13: qty 100

## 2016-10-13 MED ORDER — BUPIVACAINE HCL (PF) 0.25 % IJ SOLN
INTRAMUSCULAR | Status: DC | PRN
  Administered 2016-10-13: 10 mL via EPIDURAL

## 2016-10-13 MED ORDER — OXYTOCIN 40 UNITS IN LACTATED RINGERS INFUSION - SIMPLE MED: 2.5000 [IU]/h | INTRAVENOUS | Status: DC

## 2016-10-13 MED ORDER — IBUPROFEN 600 MG PO TABS
600.0000 mg | ORAL_TABLET | Freq: Four times a day (QID) | ORAL | Status: DC
  Administered 2016-10-14 – 2016-10-15 (×8): 600 mg via ORAL
  Filled 2016-10-13 (×8): qty 1

## 2016-10-13 MED ORDER — FENTANYL 2.5 MCG/ML W/ROPIVACAINE 0.15% IN NS 100 ML EPIDURAL (ARMC)
12.0000 mL/h | EPIDURAL | Status: DC
  Administered 2016-10-13: 12 mL/h via EPIDURAL

## 2016-10-13 MED ORDER — EPHEDRINE 5 MG/ML INJ
10.0000 mg | INTRAVENOUS | Status: DC | PRN
  Filled 2016-10-13: qty 2

## 2016-10-13 MED ORDER — DIPHENHYDRAMINE HCL 50 MG/ML IJ SOLN: 12.5000 mg | INTRAMUSCULAR | Status: DC | PRN

## 2016-10-13 MED ORDER — AMPICILLIN SODIUM 2 G IJ SOLR: 2.0000 g | Freq: Once | INTRAMUSCULAR | Status: DC

## 2016-10-13 MED ORDER — TERBUTALINE SULFATE 1 MG/ML IJ SOLN: 0.2500 mg | Freq: Once | INTRAMUSCULAR | Status: DC | PRN

## 2016-10-13 MED ORDER — OXYTOCIN 40 UNITS IN LACTATED RINGERS INFUSION - SIMPLE MED
1.0000 m[IU]/min | INTRAVENOUS | Status: DC
  Administered 2016-10-13: 2 m[IU]/min via INTRAVENOUS
  Filled 2016-10-13: qty 1000

## 2016-10-13 MED ORDER — LIDOCAINE-EPINEPHRINE (PF) 1.5 %-1:200000 IJ SOLN
INTRAMUSCULAR | Status: DC | PRN
  Administered 2016-10-13: 3 mL via PERINEURAL

## 2016-10-13 MED ORDER — PHENYLEPHRINE 40 MCG/ML (10ML) SYRINGE FOR IV PUSH (FOR BLOOD PRESSURE SUPPORT)
80.0000 ug | PREFILLED_SYRINGE | INTRAVENOUS | Status: DC | PRN
Start: 2016-10-13 — End: 2016-10-15
  Filled 2016-10-13: qty 5

## 2016-10-13 MED ORDER — BUTORPHANOL TARTRATE 1 MG/ML IJ SOLN: 1.0000 mg | INTRAMUSCULAR | Status: DC | PRN

## 2016-10-13 MED ORDER — ACETAMINOPHEN 325 MG PO TABS
650.0000 mg | ORAL_TABLET | ORAL | Status: DC | PRN
  Filled 2016-10-13 (×2): qty 2

## 2016-10-13 MED ORDER — MISOPROSTOL 25 MCG QUARTER TABLET
25.0000 ug | ORAL_TABLET | ORAL | Status: DC | PRN
  Administered 2016-10-13: 25 ug via BUCCAL
  Filled 2016-10-13: qty 1

## 2016-10-13 MED ORDER — LIDOCAINE HCL (PF) 1 % IJ SOLN
INTRAMUSCULAR | Status: DC | PRN
  Administered 2016-10-13: 3 mL

## 2016-10-13 MED ORDER — ONDANSETRON HCL 4 MG/2ML IJ SOLN: 4.0000 mg | Freq: Four times a day (QID) | INTRAMUSCULAR | Status: DC | PRN

## 2016-10-13 NOTE — Anesthesia Preprocedure Evaluation (Signed)
Anesthesia Evaluation  Patient identified by MRN, date of birth, ID band Patient awake    Reviewed: Allergy & Precautions, NPO status , Patient's Chart, lab work & pertinent test results  Airway Mallampati: II  TM Distance: >3 FB     Dental   Pulmonary asthma ,    Pulmonary exam normal        Cardiovascular negative cardio ROS Normal cardiovascular exam     Neuro/Psych negative neurological ROS  negative psych ROS   GI/Hepatic negative GI ROS, Neg liver ROS,   Endo/Other  negative endocrine ROS  Renal/GU negative Renal ROS  negative genitourinary   Musculoskeletal negative musculoskeletal ROS (+)   Abdominal Normal abdominal exam  (+)   Peds negative pediatric ROS (+)  Hematology negative hematology ROS (+)   Anesthesia Other Findings   Reproductive/Obstetrics (+) Pregnancy                             Anesthesia Physical Anesthesia Plan  ASA: II  Anesthesia Plan: Epidural   Post-op Pain Management:    Induction:   PONV Risk Score and Plan:   Airway Management Planned: Natural Airway  Additional Equipment:   Intra-op Plan:   Post-operative Plan:   Informed Consent: I have reviewed the patients History and Physical, chart, labs and discussed the procedure including the risks, benefits and alternatives for the proposed anesthesia with the patient or authorized representative who has indicated his/her understanding and acceptance.   Dental advisory given  Plan Discussed with: CRNA and Surgeon  Anesthesia Plan Comments:         Anesthesia Quick Evaluation

## 2016-10-13 NOTE — H&P (Addendum)
OB ADMISSION/ HISTORY & PHYSICAL:  Admission Date: 10/13/2016 10:18 AM  Admit Diagnosis: Elective induction of labor for suspected LGA baby  Lindsey Deleon is a 26 y.o. female presenting for iol.  Prenatal History: G1P0   EDC : 10/19/2016, by Other Basis  [facility] Prenatal course complicated by  - small subchorionic hemorrgae in early pregnancy - THC early in pregnancy - LGA: EFW today in office 4353g, 9#10oz, vtx. AFI 20cm - Left Skene's gland cyst, evaluated by urology and MRI  Prenatal Labs: MBT A+ Ab screen Neg Pap HIV neg Neg Hep B/RPR Neg/NR, GC/C Neg  Rubella Immune VZV Immune GBS:   negative  Medical / Surgical History :  Past medical history:  Past Medical History:  Diagnosis Date  . Asthma    childhood asthma  . History of cyst of urethra 09/30/2016   current cyst     Past surgical history:  Past Surgical History:  Procedure Laterality Date  . WISDOM TOOTH EXTRACTION      Family History:  Family History  Problem Relation Age of Onset  . Bladder Cancer Neg Hx   . Kidney cancer Neg Hx      Social History:  reports that she has never smoked. She has never used smokeless tobacco. She reports that she does not drink alcohol or use drugs.   Allergies: Patient has no known allergies.    Current Medications at time of admission:  Prior to Admission medications   Medication Sig Start Date End Date Taking? Authorizing Provider  Prenatal Multivit-Min-Fe-FA (PRENATAL VITAMINS PO) Take by mouth.   Yes [provider]     Review of Systems: Active FM Contractions q4 min, not feeling them   Physical Exam:  VS: Blood pressure 119/74, pulse 94, temperature 98 F (36.7 C), temperature source Oral, resp. rate 18, height 5\' 5"  (1.651 m), weight 174 lb (78.9 kg), last menstrual period 01/19/2016.  General: alert and oriented, appears nad Heart: RRR Lungs: Clear lung fields Abdomen: Gravid, soft and non-tender, non-distended / uterus: non  tender Extremities: no edema  Genitalia / VE:  2/50/high  Assessment: 39+[redacted] weeks gestation FHR category 1   Plan:   Admit for induction of labor Labs pending Epidural when desired Continuous fetal monitoring   1. Fetal Well being  - Fetal Tracing: cat I - Ultrasound:  EFW 4300 - Group B Streptococcus: neg - Presentation: vtx confirmed by ultrasound   2. Routine OB: - Prenatal labs reviewed, as above - Rh postitive  3. Induction of Labor:  -  Contractions external toco in place -  Plan for induction with cytotec at 11:45, and FB placed just now  4. Post Partum Planning: - Infant feeding: breast

## 2016-10-13 NOTE — Progress Notes (Signed)
Lindsey Deleon is a 26 y.o. G1P0 at 4130w1d iol for LGA - now on 498mu/pit - feeling some contractions  Objective: BP 114/61   Pulse 75   Temp 98.4 F (36.9 C) (Oral)   Resp 16   Ht 5\' 5"  (1.651 m)   Wt 174 lb (78.9 kg)   LMP 01/19/2016   BMI 28.96 kg/m  No intake/output data recorded. No intake/output data recorded.  FHT:  FHR: 140 bpm, variability: moderate,  accelerations:  Present,  decelerations:  Absent UC:   regular, every 2-3 minutes SVE:   Dilation: 5 Effacement (%): 60 Station: -3 Exam by:: B.Darionna Banke, MD  Labs: Lab Results  Component Value Date   WBC 8.0 10/13/2016   HGB 12.6 10/13/2016   HCT 35.3 10/13/2016   MCV 87.8 10/13/2016   PLT 186 10/13/2016    Assessment / Plan: Augmentation of labor, progressing well  AROM for copious clear fluid Internal fetal monitoring placed Cat I strip Epidural when desired  Lindsey Deleon 10/13/2016, 6:39 PM

## 2016-10-13 NOTE — Progress Notes (Signed)
Lindsey Deleon is a [age] y.o. G1P0 at 8455w1d  Subjective: Comfortable, not feeling contractions  Objective: BP (!) 97/57 (BP Location: Right Arm)   Pulse 83   Temp 98.4 F (36.9 C) (Oral)   Resp 17   Ht 5\' 5"  (1.651 m)   Wt 174 lb (78.9 kg)   LMP 01/19/2016   BMI 28.96 kg/m  No intake/output data recorded. No intake/output data recorded.  FHT:  FHR: 120 bpm, variability: moderate,  accelerations:  Present,  decelerations:  Absent UC:   irregular, every 3 minutes SVE:   Dilation: 4 Effacement (%): 50 Exam by:: Lindsey ManifoldB. Lashawnna Lambrecht, MD  Labs: Lab Results  Component Value Date   WBC 8.0 10/13/2016   HGB 12.6 10/13/2016   HCT 35.3 10/13/2016   MCV 87.8 10/13/2016   PLT 186 10/13/2016    Assessment / Plan: Cephalic confirmed by bedside u/s Foley bulb out Wills tart picocin now, 4 hrs from last cytotec Anticipated MOD:  NSVD  Christeen DouglasBethany Caralina Nop 10/13/2016, 4:05 PM

## 2016-10-13 NOTE — Discharge Summary (Signed)
Obstetrical Discharge Summary  Patient Name: Lindsey Deleon DOB: 1990-11-02 MRN: [id number]  Date of Admission: 10/13/2016 Date of Discharge: 10/15/2016  [facility]: [facility]  Gestational Age at Delivery: [redacted]w[redacted]d   Antepartum complications:  - small subchorionic hemorrgae in early pregnancy - THC early in pregnancy - LGA: EFW today in office 4353g, 9#10oz, vtx. AFI 20cm - Left Skene's gland cyst, evaluated by urology and MRI  Admitting Diagnosis: LGA Secondary Diagnosis: Patient Active Problem List   Diagnosis Date Noted  . Large for gestational age fetus affecting management of mother 10/13/2016  . Labor and delivery indication for care or intervention 10/08/2016  . Pregnancy 09/30/2016    Augmentation: AROM, Pitocin, Cytotec and Foley Balloon Complications: None Intrapartum complications/course:  NSVD after induction Drainage of left skene's glad Repair of 3rd deg laceration  Date of Delivery: 10/13/16 Delivered By: Lindsey Deleon Delivery Type: spontaneous vaginal delivery Anesthesia: epidural Placenta: Spontaneous Laceration: 3rd deg Episiotomy: none Newborn Data: Live born female "Lindsey Deleon" Birth Weight: 9 lb 2.7 oz (4160 g) APGAR:8 , 9    Discharge Physical Exam:  BP 111/63 (BP Location: Left Arm)   Pulse 74   Temp 98.7 F (37.1 C) (Oral)   Resp 20   Ht  (1.651 m)   Wt 174 lb (78.9 kg)   LMP 01/19/2016   SpO2 99%   Breastfeeding? Unknown   BMI 28.96 kg/m   General: NAD CV: RRR Pulm: CTABL, nl effort ABD: s/nd/nt, fundus firm and below the umbilicus Lochia: moderate Incision: c/d/i  DVT Evaluation: LE non-ttp, no evidence of DVT on exam.  Hemoglobin  Date Value Ref Range Status  10/14/2016 11.7 (L) 12.0 - 16.0 g/dL Final   HGB  Date Value Ref Range Status  08/03/2012 13.2 12.0 - 16.0 g/dL Final   HCT  Date Value Ref Range Status  10/14/2016 33.4 (L) 35.0 - 47.0 % Final  08/03/2012 39.1 35.0 - 47.0 % Final    Post partum  course: Uncomplicated Postpartum Procedures: none Disposition: stable, discharge to home. Baby Feeding: breastmilk and formula Baby Disposition: home with mom  Rh Immune globulin given: n/a Rubella vaccine given: n/a Prenatal Labs:   A pos, HIV neg, HEB B neg, RPR, NR, RI VI, GBS neg  Plan:  Lindsey [place]on was discharged to home in good condition. Follow-up appointment at [facility] in 2 weeks for eval of skene's gland    Discharge Medications: Allergies as of 10/15/2016   No Known Allergies     Medication List    TAKE these medications   docusate sodium 100 MG capsule Commonly known as:  COLACE Take 1 capsule (100 mg total) by mouth 2 (two) times daily. To keep stools soft, as needed   ferrous sulfate 325 (65 FE) MG tablet Take 1 tablet (325 mg total) by mouth daily with breakfast. Take with Vitamin C   ibuprofen 800 MG tablet Commonly known as:  ADVIL,MOTRIN Take 1 tablet (800 mg total) by mouth every 8 (eight) hours as needed for moderate pain or cramping.   oxyCODONE-acetaminophen 5-325 MG tablet Commonly known as:  PERCOCET/ROXICET Take 1-2 tablets by mouth every 6 (six) hours as needed for severe pain.   PRENATAL VITAMINS PO Take by mouth.            Discharge Care Instructions        Start     Ordered   10/15/16 0000  docusate sodium (COLACE) 100 MG capsule  2 times daily  10/15/16 1622   10/15/16 0000  ferrous sulfate 325 (65 FE) MG tablet  Daily with breakfast     10/15/16 1622   10/15/16 0000  ibuprofen (ADVIL,MOTRIN) 800 MG tablet  Every 8 hours PRN     10/15/16 1622   10/15/16 0000  oxyCODONE-acetaminophen (PERCOCET/ROXICET) 5-325 MG tablet  Every 6 hours PRN     10/15/16 1622      Follow-up Information    Lindsey DouglasBeasley, Lindsey Carrender, MD Follow up in 2 week(s).   Specialty:  Obstetrics and Gynecology Why:  Call to make follow up appointment. But if you have concerns, call earlier. There is lots of breastfeeding support we can get you  to. Contact information: [address] [place] [address] [phone]           Signed: Christeen DouglasBethany Shahan [place] 10/15/16

## 2016-10-13 NOTE — Anesthesia Procedure Notes (Signed)
Epidural Patient location during procedure: OB Start time: 10/13/2016 7:18 PM End time: 10/13/2016 7:22 PM  Staffing Anesthesiologist: Yves DillARROLL, Hanan Moen Performed: anesthesiologist   Preanesthetic Checklist Completed: patient identified, site marked, surgical consent, pre-op evaluation, timeout performed, IV checked, risks and benefits discussed and monitors and equipment checked  Epidural Patient position: sitting Prep: Betadine Patient monitoring: heart rate, continuous pulse ox and blood pressure Approach: midline Location: L3-L4 Injection technique: LOR air  Needle:  Needle type: Tuohy  Needle gauge: 17 G Needle length: 9 cm and 9 Catheter type: closed end flexible Catheter size: 19 Gauge Test dose: negative and 1.5% lidocaine with Epi 1:200 K  Assessment Events: blood not aspirated, injection not painful, no injection resistance, negative IV test and no paresthesia  Additional Notes Time out called.  Patient placed in sitting position.  Back prepped and draped in sterile fashion.  A skin wheal was made in the L3-L4 interspace with 1% Lidocaine plain.  A 17G Tuohy needle was advanced to the epidural space by a loss of resistance technique.  The epidural catheter was threaded 3cm into the space and the TD was negative.  No blood or paresthesias.  The catheter was affixed to the back in sterile fashion.  The patient tolerated the procedure well.Reason for block:procedure for pain

## 2016-10-13 NOTE — Progress Notes (Signed)
Patient ID: Lindsey [place], female   DOB: 07-09-1990, 26 y.o.   MRN: [id number]  Pt fully dilated, feeling the urge to push. Epidural working well.  SVE: 10/100/+2 Toco: q2 min, pitocin at 8 mu/min FHT: Cat II  A/P: 25yo G1P0 at 39+1wks, suspected LGA - continue pushing with attention to labor curve - stool at bedside - excellent pushing efforts

## 2016-10-14 LAB — CBC
HCT: 33.4 % — ABNORMAL LOW (ref 35.0–47.0)
HEMOGLOBIN: 11.7 g/dL — AB (ref 12.0–16.0)
MCH: 30.9 pg (ref 26.0–34.0)
MCHC: 35.1 g/dL (ref 32.0–36.0)
MCV: 88 fL (ref 80.0–100.0)
PLATELETS: 190 10*3/uL (ref 150–440)
RBC: 3.8 MIL/uL (ref 3.80–5.20)
RDW: 13.3 % (ref 11.5–14.5)
WBC: 14.9 10*3/uL — ABNORMAL HIGH (ref 3.6–11.0)

## 2016-10-14 LAB — RPR: RPR: NONREACTIVE

## 2016-10-14 MED ORDER — TETANUS-DIPHTH-ACELL PERTUSSIS 5-2.5-18.5 LF-MCG/0.5 IM SUSP: 0.5000 mL | Freq: Once | INTRAMUSCULAR | Status: DC

## 2016-10-14 MED ORDER — ONDANSETRON HCL 4 MG PO TABS: 4.0000 mg | ORAL_TABLET | ORAL | Status: DC | PRN

## 2016-10-14 MED ORDER — PRENATAL MULTIVITAMIN CH
1.0000 | ORAL_TABLET | Freq: Every day | ORAL | Status: DC
  Administered 2016-10-14 – 2016-10-15 (×2): 1 via ORAL
  Filled 2016-10-14 (×2): qty 1

## 2016-10-14 MED ORDER — WITCH HAZEL-GLYCERIN EX PADS
1.0000 "application " | MEDICATED_PAD | CUTANEOUS | Status: DC
  Administered 2016-10-14: 1 via TOPICAL
  Filled 2016-10-14: qty 100

## 2016-10-14 MED ORDER — DIBUCAINE 1 % RE OINT
1.0000 "application " | TOPICAL_OINTMENT | RECTAL | Status: DC | PRN
  Administered 2016-10-14: 1 via RECTAL
  Filled 2016-10-14: qty 28

## 2016-10-14 MED ORDER — BISACODYL 10 MG RE SUPP: 10.0000 mg | Freq: Every day | RECTAL | Status: DC | PRN

## 2016-10-14 MED ORDER — SODIUM CHLORIDE 0.9% FLUSH: 3.0000 mL | INTRAVENOUS | Status: DC | PRN

## 2016-10-14 MED ORDER — SODIUM CHLORIDE 0.9% FLUSH: 3.0000 mL | Freq: Two times a day (BID) | INTRAVENOUS | Status: DC

## 2016-10-14 MED ORDER — FLEET ENEMA 7-19 GM/118ML RE ENEM: 1.0000 | ENEMA | Freq: Every day | RECTAL | Status: DC | PRN

## 2016-10-14 MED ORDER — DIPHENHYDRAMINE HCL 25 MG PO CAPS: 25.0000 mg | ORAL_CAPSULE | Freq: Four times a day (QID) | ORAL | Status: DC | PRN

## 2016-10-14 MED ORDER — SENNOSIDES-DOCUSATE SODIUM 8.6-50 MG PO TABS
2.0000 | ORAL_TABLET | ORAL | Status: DC
  Administered 2016-10-15: 2 via ORAL
  Filled 2016-10-14: qty 2

## 2016-10-14 MED ORDER — DIBUCAINE 1 % RE OINT: 1.0000 "application " | TOPICAL_OINTMENT | RECTAL | Status: DC | PRN

## 2016-10-14 MED ORDER — WITCH HAZEL-GLYCERIN EX PADS: 1.0000 "application " | MEDICATED_PAD | CUTANEOUS | Status: DC | PRN

## 2016-10-14 MED ORDER — COCONUT OIL OIL
1.0000 "application " | TOPICAL_OIL | Status: DC | PRN
  Administered 2016-10-15: 1 via TOPICAL
  Filled 2016-10-14: qty 120

## 2016-10-14 MED ORDER — ACETAMINOPHEN 325 MG PO TABS
650.0000 mg | ORAL_TABLET | ORAL | Status: DC | PRN
  Administered 2016-10-14: 650 mg via ORAL

## 2016-10-14 MED ORDER — HYDROCORTISONE 2.5 % RE CREA
TOPICAL_CREAM | RECTAL | Status: DC
  Administered 2016-10-14: 18:00:00 via RECTAL
  Filled 2016-10-14: qty 28.35

## 2016-10-14 MED ORDER — ONDANSETRON HCL 4 MG/2ML IJ SOLN: 4.0000 mg | INTRAMUSCULAR | Status: DC | PRN

## 2016-10-14 MED ORDER — MEASLES, MUMPS & RUBELLA VAC ~~LOC~~ INJ: 0.5000 mL | INJECTION | Freq: Once | SUBCUTANEOUS | Status: DC

## 2016-10-14 MED ORDER — SODIUM CHLORIDE 0.9 % IV SOLN: 250.0000 mL | INTRAVENOUS | Status: DC | PRN

## 2016-10-14 MED ORDER — ZOLPIDEM TARTRATE 5 MG PO TABS: 5.0000 mg | ORAL_TABLET | Freq: Every evening | ORAL | Status: DC | PRN

## 2016-10-14 MED ORDER — SIMETHICONE 80 MG PO CHEW: 80.0000 mg | CHEWABLE_TABLET | ORAL | Status: DC | PRN

## 2016-10-14 MED ORDER — OXYCODONE HCL 5 MG PO TABS
5.0000 mg | ORAL_TABLET | ORAL | Status: DC | PRN
Start: 2016-10-14 — End: 2016-10-15
  Administered 2016-10-14 – 2016-10-15 (×7): 5 mg via ORAL
  Filled 2016-10-14 (×7): qty 1

## 2016-10-14 NOTE — Anesthesia Postprocedure Evaluation (Signed)
Anesthesia Post Note  Patient: Lindsey Deleon  Procedure(s) Performed: * No procedures listed *  Patient location during evaluation: [facility] Anesthesia Type: Epidural Level of consciousness: awake and awake and alert Pain management: pain level controlled Vital Signs Assessment: post-procedure vital signs reviewed and stable Respiratory status: spontaneous breathing Cardiovascular status: blood pressure returned to baseline Postop Assessment: no headache Anesthetic complications: no     Last Vitals:  Vitals:   10/14/16 0618 10/14/16 0730  BP: (!) 107/54 119/67  Pulse: 78 67  Resp: 16 18  Temp: 36.6 C 36.7 C  SpO2: 99% 99%    Last Pain:  Vitals:   10/14/16 0730  TempSrc: Oral  PainSc:                  Vernie MurdersPope,  Raelea Gosse G

## 2016-10-14 NOTE — Progress Notes (Signed)
Post Partum Day 1 Subjective: Doing well, no complaints.  Tolerating regular diet, pain with PO meds, voiding and ambulating without difficulty.  No CP SOB F/C N/V or leg pain   Objective: BP (!) 117/58 (BP Location: Left Arm)   Pulse 66   Temp 97.7 F (36.5 C) (Oral)   Resp 17   Ht 5\' 5"  (1.651 m)   Wt 78.9 kg (174 lb)   LMP 01/19/2016   SpO2 99%    BMI 28.96 kg/m    Physical Exam:  General: NAD CV: RRR Pulm: nl effort, CTABL Lochia: moderate Uterine Fundus: fundus firm and below umbilicus DVT Evaluation: no cords, ttp LEs    Recent Labs  10/13/16 1108 10/14/16 0558  HGB 12.6 11.7*  HCT 35.3 33.4*  WBC 8.0 14.9*  PLT 186 190    Assessment/Plan: 25 y.o. G1P1001 postpartum day # 1  1. Routine post partum care 2. Perineal care: add witch hazel pads and hydrocortisone cream 3. Anticipated d/c tomorrow  ----- Ranae Plumberhelsea Michaelle Bottomley, MD Attending Obstetrician and Gynecologist [facility], Department of OB/GYN [facility]

## 2016-10-15 MED ORDER — DOCUSATE SODIUM 100 MG PO CAPS: 100.0000 mg | ORAL_CAPSULE | Freq: Two times a day (BID) | ORAL | 0 refills | Status: AC

## 2016-10-15 MED ORDER — IBUPROFEN 800 MG PO TABS: 800.0000 mg | ORAL_TABLET | Freq: Three times a day (TID) | ORAL | 1 refills | Status: DC | PRN

## 2016-10-15 MED ORDER — FERROUS SULFATE 325 (65 FE) MG PO TABS: 325.0000 mg | ORAL_TABLET | Freq: Every day | ORAL | 1 refills | Status: DC

## 2016-10-15 MED ORDER — OXYCODONE-ACETAMINOPHEN 5-325 MG PO TABS: 1.0000 | ORAL_TABLET | Freq: Four times a day (QID) | ORAL | 0 refills | Status: DC | PRN

## 2016-10-15 NOTE — Anesthesia Postprocedure Evaluation (Deleted)
Anesthesia Post Note  Patient: Lindsey AyeCasey L Deleon  Procedure(s) Performed: * No procedures listed *  Patient location during evaluation: [facility] Anesthesia Type: Epidural Level of consciousness: awake and alert Pain management: pain level controlled Vital Signs Assessment: post-procedure vital signs reviewed and stable Respiratory status: spontaneous breathing, nonlabored ventilation and respiratory function stable Cardiovascular status: stable Postop Assessment: no headache, no backache and patient able to bend at knees Anesthetic complications: no     Last Vitals:  Vitals:   10/14/16 1934 10/15/16 0730  BP: (!) 112/58 111/63  Pulse: 86 74  Resp: 18 20  Temp: 36.5 C (!) 36.3 C  SpO2: 100% 99%    Last Pain:  Vitals:   10/15/16 0730  TempSrc: Oral  PainSc:                  Cleda MccreedyJoseph K Arrionna Serena

## 2016-10-15 NOTE — Progress Notes (Signed)
Pt watching Period of Purple Cry.

## 2016-10-15 NOTE — Progress Notes (Signed)
D/C instructions provided, pt states understanding, aware of follow up appt.  Prescriptions given to pt.  D/C home to car via auxiliary in wheelchair.

## 2016-10-15 NOTE — Addendum Note (Signed)
Addendum  created 10/15/16 [id number] by Piscitello, Cleda MccreedyJoseph K, MD   Delete clinical note

## 2018-01-13 ENCOUNTER — Emergency Department: Payer: No Typology Code available for payment source

## 2018-01-13 ENCOUNTER — Encounter: Payer: Self-pay | Admitting: Emergency Medicine

## 2018-01-13 ENCOUNTER — Other Ambulatory Visit: Payer: Self-pay

## 2018-01-13 ENCOUNTER — Emergency Department
Admission: EM | Admit: 2018-01-13 | Discharge: 2018-01-13 | Disposition: A | Discharge status: Home / Self Care | Payer: No Typology Code available for payment source | Attending: Emergency Medicine | Admitting: Emergency Medicine

## 2018-01-13 DIAGNOSIS — S60212A Contusion of left wrist, initial encounter: Secondary | ICD-10-CM | POA: Diagnosis present

## 2018-01-13 DIAGNOSIS — Y999 Unspecified external cause status: Secondary | ICD-10-CM | POA: Diagnosis not present

## 2018-01-13 DIAGNOSIS — Y939 Activity, unspecified: Secondary | ICD-10-CM | POA: Diagnosis not present

## 2018-01-13 DIAGNOSIS — Y929 Unspecified place or not applicable: Secondary | ICD-10-CM | POA: Insufficient documentation

## 2018-01-13 MED ORDER — TRAMADOL HCL 50 MG PO TABS: 50.0000 mg | ORAL_TABLET | Freq: Four times a day (QID) | ORAL | 0 refills | Status: DC | PRN

## 2018-01-13 MED ORDER — MELOXICAM 15 MG PO TABS: 15.0000 mg | ORAL_TABLET | Freq: Every day | ORAL | 0 refills | Status: DC
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
## 2018-01-13 NOTE — ED Notes (Signed)
Pt verbalized understanding of discharge instructions. NAD at this time. 

## 2018-01-13 NOTE — ED Notes (Signed)
Pt with c/o of left forearm and hand pain. Swelling and bruising noted to both.

## 2019-01-14 ENCOUNTER — Other Ambulatory Visit: Payer: Self-pay

## 2019-01-14 DIAGNOSIS — Z20822 Contact with and (suspected) exposure to covid-19: Secondary | ICD-10-CM

## 2019-01-15 ENCOUNTER — Telehealth: Payer: Self-pay

## 2019-01-15 LAB — NOVEL CORONAVIRUS, NAA: SARS-CoV-2, NAA: NOT DETECTED

## 2019-01-15 NOTE — Telephone Encounter (Signed)
Received call from patient's mother checking Covid results.  Advised results negative.   

## 2019-04-02 ENCOUNTER — Other Ambulatory Visit: Payer: Self-pay | Admitting: Otolaryngology

## 2019-04-02 DIAGNOSIS — H9311 Tinnitus, right ear: Secondary | ICD-10-CM

## 2019-04-11 ENCOUNTER — Ambulatory Visit
Admission: RE | Admit: 2019-04-11 | Discharge: 2019-04-11 | Disposition: A | Discharge status: Home / Self Care | Payer: Medicaid Other | Source: Ambulatory Visit | Attending: Otolaryngology | Admitting: Otolaryngology

## 2019-04-11 ENCOUNTER — Other Ambulatory Visit: Payer: Self-pay

## 2019-04-11 DIAGNOSIS — H9311 Tinnitus, right ear: Secondary | ICD-10-CM | POA: Diagnosis not present

## 2019-04-11 MED ORDER — GADOBUTROL 1 MMOL/ML IV SOLN
7.0000 mL | Freq: Once | INTRAVENOUS | Status: AC | PRN
  Administered 2019-04-11: 7 mL via INTRAVENOUS

## 2019-08-14 ENCOUNTER — Ambulatory Visit
Admission: RE | Admit: 2019-08-14 | Discharge: 2019-08-14 | Disposition: A | Discharge status: Home / Self Care | Payer: Medicaid Other | Source: Ambulatory Visit | Attending: Emergency Medicine | Admitting: Emergency Medicine

## 2019-08-14 ENCOUNTER — Other Ambulatory Visit: Payer: Self-pay

## 2019-08-14 VITALS — BP 113/77 | HR 79 | Temp 99.9°F | Resp 16

## 2019-08-14 DIAGNOSIS — R059 Cough, unspecified: Secondary | ICD-10-CM

## 2019-08-14 DIAGNOSIS — J069 Acute upper respiratory infection, unspecified: Secondary | ICD-10-CM | POA: Diagnosis not present

## 2019-08-14 DIAGNOSIS — R05 Cough: Secondary | ICD-10-CM

## 2019-08-14 MED ORDER — BENZONATATE 100 MG PO CAPS: 100.0000 mg | ORAL_CAPSULE | Freq: Three times a day (TID) | ORAL | 0 refills | Status: DC | PRN

## 2019-08-14 NOTE — Discharge Instructions (Signed)
Take the [facility] as needed for cough.    Your COVID test is pending.  You should self quarantine until the test result is back.    Take Tylenol as needed for fever or discomfort.  Rest and keep yourself hydrated.    Go to the emergency department if you develop shortness of breath, severe diarrhea, high fever not relieved by Tylenol or ibuprofen, or other concerning symptoms.

## 2019-08-14 NOTE — ED Triage Notes (Signed)
C/o cough, headaches, chest congestion, and mild ShOB. Denies: fever, sore throat, or n/v/d.  OTC: claritin

## 2019-08-14 NOTE — ED Provider Notes (Signed)
Renaldo Fiddler    CSN: [id number] Arrival date & time: 08/14/19  1441      History   Chief Complaint Chief Complaint  Patient presents with  . Cough  . Headache  . Nasal Congestion    HPI Lindsey Deleon is a 29 y.o. female.   Patient presents with a cough productive of green phlegm, headache, chest congestion, mild shortness of breath x3 to 4 days.  She states she had sinus congestion last week but this has resolved without treatment.  She denies fever, chills, ear pain, sore throat, vomiting, diarrhea, rash, or other symptoms.  Treatment attempted at home with Claritin.  The history is provided by the patient.    Past Medical History:  Diagnosis Date  . Asthma    childhood asthma  . History of cyst of urethra 09/30/2016   current cyst    Patient Active Problem List   Diagnosis Date Noted  . Large for gestational age fetus affecting management of mother 10/13/2016  . Labor and delivery indication for care or intervention 10/08/2016  . Pregnancy 09/30/2016    Past Surgical History:  Procedure Laterality Date  . WISDOM TOOTH EXTRACTION      OB History    Gravida  1   Para  1   Term  1   Preterm      AB      Living  1     SAB      TAB      Ectopic      Multiple  0   Live Births  1            Home Medications    Prior to Admission medications   Medication Sig Start Date End Date Taking? Authorizing Provider  benzonatate (TESSALON) 100 MG capsule Take 1 capsule (100 mg total) by mouth 3 (three) times daily as needed for cough. 08/14/19   Mickie Bail, NP  ferrous sulfate 325 (65 FE) MG tablet Take 1 tablet (325 mg total) by mouth daily with breakfast. Take with Vitamin C 10/15/16 12/14/16  Christeen Douglas, MD  ibuprofen (ADVIL,MOTRIN) 800 MG tablet Take 1 tablet (800 mg total) by mouth every 8 (eight) hours as needed for moderate pain or cramping. 10/15/16   Christeen Douglas, MD  meloxicam (MOBIC) 15 MG tablet Take 1 tablet (15 mg total) by  mouth daily. 01/13/18   Cuthriell, Delorise Royals, PA-C  oxyCODONE-acetaminophen (PERCOCET/ROXICET) 5-325 MG tablet Take 1-2 tablets by mouth every 6 (six) hours as needed for severe pain. 10/15/16   Christeen Douglas, MD  Prenatal Multivit-Min-Fe-FA (PRENATAL VITAMINS PO) Take by mouth.    [provider]  traMADol (ULTRAM) 50 MG tablet Take 1 tablet (50 mg total) by mouth every 6 (six) hours as needed. 01/13/18   Cuthriell, Delorise Royals, PA-C    Family History Family History  Problem Relation Age of Onset  . Bladder Cancer Neg Hx   . Kidney cancer Neg Hx     Social History Social History   Tobacco Use  . Smoking status: Never Smoker  . Smokeless tobacco: Never Used  . Tobacco comment: never smoked tobacco products  Vaping Use  . Vaping Use: Never used  Substance Use Topics  . Alcohol use: Yes    Comment: occ  . Drug use: No     Allergies   Patient has no known allergies.   Review of Systems Review of Systems  Constitutional: Negative for chills and fever.  HENT:  Negative for congestion, ear pain, rhinorrhea, sinus pressure and sore throat.   Eyes: Negative for pain and visual disturbance.  Respiratory: Positive for cough and shortness of breath.   Cardiovascular: Negative for chest pain and palpitations.  Gastrointestinal: Negative for abdominal pain, diarrhea, nausea and vomiting.  Genitourinary: Negative for dysuria and hematuria.  Musculoskeletal: Negative for arthralgias and back pain.  Skin: Negative for color change and rash.  Neurological: Positive for headaches. Negative for seizures, syncope, weakness and numbness.  All other systems reviewed and are negative.    Physical Exam Triage Vital Signs ED Triage Vitals  Enc Vitals Group     BP      Pulse      Resp      Temp      Temp src      SpO2      Weight      Height      Head Circumference      Peak Flow      Pain Score      Pain Loc      Pain Edu?      Excl. in GC?    No data  found.  Updated Vital Signs BP 113/77   Pulse 79   Temp 99.9 F (37.7 C)   Resp 16   LMP 08/02/2019 (Within Days)   SpO2 98%   Breastfeeding No   Visual Acuity Right Eye Distance:   Left Eye Distance:   Bilateral Distance:    Right Eye Near:   Left Eye Near:    Bilateral Near:     Physical Exam Vitals and nursing note reviewed.  Constitutional:      General: She is not in acute distress.    Appearance: She is well-developed. She is not ill-appearing.  HENT:     Head: Normocephalic and atraumatic.     Right Ear: Tympanic membrane normal.     Left Ear: Tympanic membrane normal.     Nose: Nose normal.     Mouth/Throat:     Mouth: Mucous membranes are moist.     Pharynx: Oropharynx is clear.     Comments: Clear postnasal drip. Eyes:     Conjunctiva/sclera: Conjunctivae normal.  Cardiovascular:     Rate and Rhythm: Normal rate and regular rhythm.     Heart sounds: No murmur heard.   Pulmonary:     Effort: Pulmonary effort is normal. No respiratory distress.     Breath sounds: Normal breath sounds. No wheezing or rhonchi.  Abdominal:     Palpations: Abdomen is soft.     Tenderness: There is no abdominal tenderness. There is no guarding or rebound.  Musculoskeletal:     Cervical back: Neck supple.  Skin:    General: Skin is warm and dry.     Findings: No rash.  Neurological:     General: No focal deficit present.     Mental Status: She is alert and oriented to person, place, and time.     Gait: Gait normal.  Psychiatric:        Mood and Affect: Mood normal.        Behavior: Behavior normal.      UC Treatments / Results  Labs (all labs ordered are listed, but only abnormal results are displayed) Labs Reviewed  NOVEL CORONAVIRUS, NAA    EKG   Radiology No results found.  Procedures Procedures (including critical care time)  Medications Ordered in UC Medications - No data to display  Initial Impression / Assessment and Plan / UC Course  I have  reviewed the triage vital signs and the nursing notes.  Pertinent labs & imaging results that were available during my care of the patient were reviewed by me and considered in my medical decision making (see chart for details).   Cough, URI.  Treating cough with Tessalon Perles.  PCR COVID test performed here.  Instructed patient to self quarantine until the test result is back.  Discussed with patient that she can take Tylenol as needed for fever or discomfort.  Instructed patient to go to the emergency department if she develops high fever, shortness of breath, severe diarrhea, or other concerning symptoms.  Patient agrees with plan of care.     Final Clinical Impressions(s) / UC Diagnoses   Final diagnoses:  Cough  Upper respiratory tract infection, unspecified type     Discharge Instructions     Take the Tessalon Perles as needed for cough.    Your COVID test is pending.  You should self quarantine until the test result is back.    Take Tylenol as needed for fever or discomfort.  Rest and keep yourself hydrated.    Go to the emergency department if you develop shortness of breath, severe diarrhea, high fever not relieved by Tylenol or ibuprofen, or other concerning symptoms.        ED Prescriptions    Medication Sig Dispense Auth. Provider   benzonatate (TESSALON) 100 MG capsule Take 1 capsule (100 mg total) by mouth 3 (three) times daily as needed for cough. 21 capsule Mickie Bail, NP     PDMP not reviewed this encounter.   Mickie Bail, NP 08/14/19 1521

## 2019-08-15 LAB — SARS-COV-2, NAA 2 DAY TAT

## 2019-08-15 LAB — NOVEL CORONAVIRUS, NAA: SARS-CoV-2, NAA: NOT DETECTED

## 2019-10-22 ENCOUNTER — Ambulatory Visit
Admission: EM | Admit: 2019-10-22 | Discharge: 2019-10-22 | Disposition: A | Discharge status: Home / Self Care | Payer: Medicaid Other | Attending: Family Medicine | Admitting: Family Medicine

## 2019-10-22 ENCOUNTER — Other Ambulatory Visit: Payer: Self-pay

## 2019-10-22 DIAGNOSIS — B349 Viral infection, unspecified: Secondary | ICD-10-CM | POA: Diagnosis not present

## 2019-10-22 DIAGNOSIS — R519 Headache, unspecified: Secondary | ICD-10-CM | POA: Diagnosis present

## 2019-10-22 DIAGNOSIS — R5383 Other fatigue: Secondary | ICD-10-CM | POA: Diagnosis present

## 2019-10-22 DIAGNOSIS — R0981 Nasal congestion: Secondary | ICD-10-CM | POA: Insufficient documentation

## 2019-10-22 DIAGNOSIS — Z1152 Encounter for screening for COVID-19: Secondary | ICD-10-CM | POA: Diagnosis present

## 2019-10-22 DIAGNOSIS — J029 Acute pharyngitis, unspecified: Secondary | ICD-10-CM | POA: Insufficient documentation

## 2019-10-22 DIAGNOSIS — R6883 Chills (without fever): Secondary | ICD-10-CM | POA: Diagnosis present

## 2019-10-22 LAB — POCT RAPID STREP A (OFFICE): Rapid Strep A Screen: NEGATIVE

## 2019-10-22 MED ORDER — CETIRIZINE-PSEUDOEPHEDRINE ER 5-120 MG PO TB12: 1.0000 | ORAL_TABLET | Freq: Every day | ORAL | 0 refills | Status: DC

## 2019-10-22 MED ORDER — FLUTICASONE PROPIONATE 50 MCG/ACT NA SUSP: 2.0000 | Freq: Every day | NASAL | 0 refills | Status: DC

## 2019-10-22 NOTE — ED Triage Notes (Addendum)
Pt is here with a sore throat and diarrhea that started 3 days ago, pt has taken Tylenol to relieve discomfort.

## 2019-10-22 NOTE — Discharge Instructions (Signed)
Your rapid strep test is negative.  A throat culture is pending; we will call you if it is positive requiring treatment.    Your COVID test is pending.  You should self quarantine until the test result is back.    Take Tylenol as needed for fever or discomfort.  Rest and keep yourself hydrated.    Go to the emergency department if you develop acute worsening symptoms.     

## 2019-10-22 NOTE — ED Provider Notes (Signed)
[facility]ARE [facility]ENTER   [id number] 10/22/19 Arrival Time: 1649   CC: COVID symptoms  SUBJECTIVE: History from: patient.  Lindsey Deleon is a 29 y.o. female who presents with abrupt onset of nasal congestion, PND, chills, headache, nasal congestion and persistent dry cough for 2 days.  Reports that she was around her father-in-law about a week ago, and now she has been told that their entire household has Covid. Denies recent travel. Has negative history of Covid. Has not completed Covid vaccines. Has not taken OTC medications for this. There are no aggravating or alleviating factors. Denies previous symptoms in the past. Denies fever, sinus pain, SOB, wheezing, chest pain, nausea, changes in bowel or bladder habits.    ROS: As per HPI.  All other pertinent ROS negative.     Past Medical History:  Diagnosis Date   Asthma    childhood asthma   History of cyst of urethra 09/30/2016   current cyst   Past Surgical History:  Procedure Laterality Date   WISDOM TOOTH EXTRACTION     No Known Allergies No current facility-administered medications on file prior to encounter.   Current Outpatient Medications on File Prior to Encounter  Medication Sig Dispense Refill   benzonatate (TESSALON) 100 MG capsule Take 1 capsule (100 mg total) by mouth 3 (three) times daily as needed for cough. 21 capsule 0   ferrous sulfate 325 (65 FE) MG tablet Take 1 tablet (325 mg total) by mouth daily with breakfast. Take with Vitamin C 30 tablet 1   ibuprofen (ADVIL,MOTRIN) 800 MG tablet Take 1 tablet (800 mg total) by mouth every 8 (eight) hours as needed for moderate pain or cramping. 30 tablet 1   meloxicam (MOBIC) 15 MG tablet Take 1 tablet (15 mg total) by mouth daily. 30 tablet 0   oxyCODONE-acetaminophen (PERCOCET/ROXICET) 5-325 MG tablet Take 1-2 tablets by mouth every 6 (six) hours as needed for severe pain. 6 tablet 0   Prenatal Multivit-Min-Fe-FA (PRENATAL VITAMINS PO) Take by mouth.      traMADol (ULTRAM) 50 MG tablet Take 1 tablet (50 mg total) by mouth every 6 (six) hours as needed. 10 tablet 0   Social History   Socioeconomic History   Marital status: Single    Spouse name: Not on file   Number of children: Not on file   Years of education: Not on file   Highest education level: Not on file  Occupational History   Not on file  Tobacco Use   Smoking status: Never Smoker   Smokeless tobacco: Never Used  Vaping Use   Vaping Use: Never used  Substance and Sexual Activity   Alcohol use: Yes    Comment: occ   Drug use: No   Sexual activity: Yes    Birth control/protection: None  Other Topics Concern   Not on file  Social History Narrative   Not on file   Social Determinants of Health   Financial Resource Strain:    Difficulty of Paying Living Expenses: Not on file  Food Insecurity:    Worried About Running Out of Food in the Last Year: Not on file   Ran Out of Food in the Last Year: Not on file  Transportation Needs:    Lack of Transportation (Medical): Not on file   Lack of Transportation (Non-Medical): Not on file  Physical Activity:    Days of Exercise per Week: Not on file   Minutes of Exercise per Session: Not on file  Stress:  Feeling of Stress : Not on file  Social Connections:    Frequency of Communication with Friends and Family: Not on file   Frequency of Social Gatherings with Friends and Family: Not on file   Attends Religious Services: Not on file   Active Member of Clubs or Organizations: Not on file   Attends Banker Meetings: Not on file   Marital Status: Not on file  Intimate Partner Violence:    Fear of Current or Ex-Partner: Not on file   Emotionally Abused: Not on file   Physically Abused: Not on file   Sexually Abused: Not on file   Family History  Problem Relation Age of Onset   Hypertension Mother    Lung cancer Father    Bladder Cancer Neg Hx    Kidney cancer Neg Hx      OBJECTIVE:  Vitals:   10/22/19 1726  BP: 125/83  Pulse: 83  Resp: 18  Temp: 99.8 F (37.7 C)  TempSrc: Oral  SpO2: 98%     General appearance: alert; appears fatigued, but nontoxic; speaking in full sentences and tolerating own secretions HEENT: NCAT; Ears: EACs clear, TMs pearly gray; Eyes: PERRL.  EOM grossly intact. Sinuses: nontender; Nose: nares patent without rhinorrhea, Throat: oropharynx clear, tonsils mildly erythematous, not enlarged, uvula midline  Neck: supple with LAD Lungs: unlabored respirations, symmetrical air entry; cough: mild; no respiratory distress; CTAB Heart: regular rate and rhythm.  Radial pulses 2+ symmetrical bilaterally Skin: warm and dry Psychological: alert and cooperative; normal mood and affect  LABS:  Results for orders placed or performed during the hospital encounter of 10/22/19 (from the past 24 hour(s))  POCT rapid strep A     Status: None   Collection Time: 10/22/19  5:36 PM  Result Value Ref Range   Rapid Strep A Screen Negative Negative     ASSESSMENT & PLAN:  1. Viral illness   2. Nasal congestion   3. Sore throat   4. Encounter for screening for COVID-19   5. Other fatigue   6. Chills   7. Nonintractable headache, unspecified chronicity pattern, unspecified headache type     Meds ordered this encounter  Medications   cetirizine-pseudoephedrine (ZYRTEC-D) 5-120 MG tablet    Sig: Take 1 tablet by mouth daily.    Dispense:  30 tablet    Refill:  0    Order Specific Question:   Supervising Provider    Answer:   Merrilee Jansky [[id number]]   fluticasone (FLONASE) 50 MCG/ACT nasal spray    Sig: Place 2 sprays into both nostrils daily.    Dispense:  9.9 mL    Refill:  0    Order Specific Question:   Supervising Provider    Answer:   Merrilee Jansky [id number]   Rapid strep negative We will culture and inform of abnormal results and treat accordingly   COVID testing ordered.  It will take between 1-2 days for test  results.  Someone will contact you regarding abnormal results.    Patient should remain in quarantine until they have received Covid results.  If negative you may resume normal activities (go back to work/school) while practicing hand hygiene, social distance, and mask wearing.  If positive, patient should remain in quarantine for 10 days from symptom onset AND greater than 72 hours after symptoms resolution (absence of fever without the use of fever-reducing medication and improvement in respiratory symptoms), whichever is longer Get plenty of rest and push fluids Prescribe  Zyrtec-D  Prescribed Flonase use medications daily for symptom relief Use OTC medications like ibuprofen or tylenol as needed fever or pain Call or go to the ED if you have any new or worsening symptoms such as fever, worsening cough, shortness of breath, chest tightness, chest pain, turning blue, changes in mental status.  Reviewed expectations re: course of current medical issues. Questions answered. Outlined signs and symptoms indicating need for more acute intervention. Patient verbalized understanding. After Visit Summary given.         Moshe Cipro, NP 10/22/19 1800

## 2019-10-25 LAB — CULTURE, GROUP A STREP (THRC)

## 2019-10-25 LAB — SARS-COV-2, NAA 2 DAY TAT

## 2019-10-25 LAB — NOVEL CORONAVIRUS, NAA: SARS-CoV-2, NAA: NOT DETECTED

## 2019-11-04 ENCOUNTER — Other Ambulatory Visit: Payer: Self-pay

## 2019-11-04 ENCOUNTER — Ambulatory Visit
Admission: RE | Admit: 2019-11-04 | Discharge: 2019-11-04 | Disposition: A | Discharge status: Home / Self Care | Payer: Medicaid Other | Source: Ambulatory Visit | Attending: Family Medicine | Admitting: Family Medicine

## 2019-11-04 VITALS — BP 115/77 | HR 70 | Temp 98.9°F | Resp 14

## 2019-11-04 DIAGNOSIS — H65192 Other acute nonsuppurative otitis media, left ear: Secondary | ICD-10-CM | POA: Diagnosis not present

## 2019-11-04 DIAGNOSIS — H9203 Otalgia, bilateral: Secondary | ICD-10-CM | POA: Diagnosis not present

## 2019-11-04 MED ORDER — AMOXICILLIN-POT CLAVULANATE 875-125 MG PO TABS: 1.0000 | ORAL_TABLET | Freq: Two times a day (BID) | ORAL | 0 refills | Status: AC

## 2019-11-04 NOTE — ED Provider Notes (Signed)
[facility]ARE CENTER   [id number] 11/04/19 Arrival Time: 1240  CC: EAR PAIN  SUBJECTIVE: History from: patient.  Lindsey Deleon is a 29 y.o. female who presents with of bilateral ear pain for the past 2 weeks. Denies a precipitating event, such as swimming or wearing ear plugs. Patient states the pain is constant and achy in character. Patient has taken OTC medications for this without relief.  Was seen in this office 12 days ago, and was treated with Zyrtec-D and Flonase.  Symptoms are made worse with lying down. Reports similar symptoms in the past. Denies fever, chills, fatigue, sinus pain, rhinorrhea, ear discharge, sore throat, SOB, wheezing, chest pain, nausea, changes in bowel or bladder habits.    ROS: As per HPI.  All other pertinent ROS negative.     Past Medical History:  Diagnosis Date  . Asthma    childhood asthma  . History of cyst of urethra 09/30/2016   current cyst   Past Surgical History:  Procedure Laterality Date  . WISDOM TOOTH EXTRACTION     No Known Allergies No current facility-administered medications on file prior to encounter.   Current Outpatient Medications on File Prior to Encounter  Medication Sig Dispense Refill  . benzonatate (TESSALON) 100 MG capsule Take 1 capsule (100 mg total) by mouth 3 (three) times daily as needed for cough. 21 capsule 0  . cetirizine-pseudoephedrine (ZYRTEC-D) 5-120 MG tablet Take 1 tablet by mouth daily. 30 tablet 0  . ferrous sulfate 325 (65 FE) MG tablet Take 1 tablet (325 mg total) by mouth daily with breakfast. Take with Vitamin C 30 tablet 1  . fluticasone (FLONASE) 50 MCG/ACT nasal spray Place 2 sprays into both nostrils daily. 9.9 mL 0  . ibuprofen (ADVIL,MOTRIN) 800 MG tablet Take 1 tablet (800 mg total) by mouth every 8 (eight) hours as needed for moderate pain or cramping. 30 tablet 1  . meloxicam (MOBIC) 15 MG tablet Take 1 tablet (15 mg total) by mouth daily. 30 tablet 0  . oxyCODONE-acetaminophen  (PERCOCET/ROXICET) 5-325 MG tablet Take 1-2 tablets by mouth every 6 (six) hours as needed for severe pain. 6 tablet 0  . Prenatal Multivit-Min-Fe-FA (PRENATAL VITAMINS PO) Take by mouth.    . traMADol (ULTRAM) 50 MG tablet Take 1 tablet (50 mg total) by mouth every 6 (six) hours as needed. 10 tablet 0   Social History   Socioeconomic History  . Marital status: Single    Spouse name: Not on file  . Number of children: Not on file  . Years of education: Not on file  . Highest education level: Not on file  Occupational History  . Not on file  Tobacco Use  . Smoking status: Never Smoker  . Smokeless tobacco: Never Used  Vaping Use  . Vaping Use: Never used  Substance and Sexual Activity  . Alcohol use: Yes    Comment: occ  . Drug use: No  . Sexual activity: Yes    Birth control/protection: None  Other Topics Concern  . Not on file  Social History Narrative  . Not on file   Social Determinants of Health   Financial Resource Strain:   . Difficulty of Paying Living Expenses: Not on file  Food Insecurity:   . Worried About Programme researcher, broadcasting/film/video in the Last Year: Not on file  . Ran Out of Food in the Last Year: Not on file  Transportation Needs:   . Lack of Transportation (Medical): Not on file  .  Lack of Transportation (Non-Medical): Not on file  Physical Activity:   . Days of Exercise per Week: Not on file  . Minutes of Exercise per Session: Not on file  Stress:   . Feeling of Stress : Not on file  Social Connections:   . Frequency of Communication with Friends and Family: Not on file  . Frequency of Social Gatherings with Friends and Family: Not on file  . Attends Religious Services: Not on file  . Active Member of Clubs or Organizations: Not on file  . Attends Banker Meetings: Not on file  . Marital Status: Not on file  Intimate Partner Violence:   . Fear of Current or Ex-Partner: Not on file  . Emotionally Abused: Not on file  . Physically Abused: Not  on file  . Sexually Abused: Not on file   Family History  Problem Relation Age of Onset  . Hypertension Mother   . Lung cancer Father   . Bladder Cancer Neg Hx   . Kidney cancer Neg Hx     OBJECTIVE:  Vitals:   11/04/19 1323  BP: 115/77  Pulse: 70  Resp: 14  Temp: 98.9 F (37.2 C)  SpO2: 98%     General appearance: alert; appears fatigued HEENT: Ears: EACs clear, L TM erythematous, bulging, R TM erythematous; Eyes: PERRL, EOMI grossly; Sinuses nontender to palpation; Nose: clear rhinorrhea; Throat: oropharynx mildly erythematous, tonsils 1+ without white tonsillar exudates, uvula midline Neck: supple without LAD Lungs: unlabored respirations, symmetrical air entry; cough: absent; no respiratory distress Heart: regular rate and rhythm.  Radial pulses 2+ symmetrical bilaterally Skin: warm and dry Psychological: alert and cooperative; normal mood and affect  Imaging: No results found.   ASSESSMENT & PLAN:  1. Other non-recurrent acute nonsuppurative otitis media of left ear   2. Acute otalgia, bilateral     Meds ordered this encounter  Medications  . amoxicillin-clavulanate (AUGMENTIN) 875-125 MG tablet    Sig: Take 1 tablet by mouth 2 (two) times daily for 10 days.    Dispense:  20 tablet    Refill:  0    Order Specific Question:   Supervising Provider    Answer:   Merrilee Jansky [[id number]]    Rest and drink plenty of fluids Prescribed augmentin 875  Take medications as directed and to completion Continue to use OTC ibuprofen and/ or tylenol as needed for pain control Follow up with PCP if symptoms persists Return here or go to the ER if you have any new or worsening symptoms   Reviewed expectations re: course of current medical issues. Questions answered. Outlined signs and symptoms indicating need for more acute intervention. Patient verbalized understanding. After Visit Summary given.         Moshe Cipro, NP 11/04/19 1335

## 2019-11-04 NOTE — Discharge Instructions (Addendum)
I have sent in Augmentin for you to take twice a day for 10 days for your ear infection  Follow-up with this office or with primary care if symptoms are persisting  Follow-up with the ER for trouble swallowing, trouble breathing, high fever, other concerning symptoms

## 2019-11-04 NOTE — ED Triage Notes (Signed)
Patient c/o bilateral ear pain. Denies fever, sinus pressure, headache.

## 2020-02-03 ENCOUNTER — Other Ambulatory Visit: Payer: Self-pay

## 2020-02-03 ENCOUNTER — Ambulatory Visit
Admission: RE | Admit: 2020-02-03 | Discharge: 2020-02-03 | Disposition: A | Discharge status: Home / Self Care | Payer: BLUE CROSS/BLUE SHIELD | Source: Ambulatory Visit | Attending: Family Medicine | Admitting: Family Medicine

## 2020-02-03 VITALS — BP 118/87 | HR 91 | Temp 100.6°F | Resp 18

## 2020-02-03 DIAGNOSIS — B349 Viral infection, unspecified: Secondary | ICD-10-CM | POA: Diagnosis not present

## 2020-02-03 NOTE — ED Triage Notes (Signed)
Pt reports HA, fever, chills, nasal congestion x 2 days.  Cough starting today.  At home COVID kit is positive.  Would like confirmation test.   Last took OTC meds last night.

## 2020-02-03 NOTE — ED Provider Notes (Signed)
Lindsey Deleon    CSN: [id number] Arrival date & time: 02/03/20  4825      History   Chief Complaint Chief Complaint  Patient presents with   Fever    HPI Lindsey Deleon is a 29 y.o. female.   Patient is a 29 year old female presents today with approximate 2 days of headache, fever, chills, nasal congestion, cough.  Took 3 at home Covid test that were positive.  Has been taking over-the-counter medicines with some relief.     Past Medical History:  Diagnosis Date   Asthma    childhood asthma   History of cyst of urethra 09/30/2016   current cyst    Patient Active Problem List   Diagnosis Date Noted   Large for gestational age fetus affecting management of mother 10/13/2016   Labor and delivery indication for care or intervention 10/08/2016   Pregnancy 09/30/2016    Past Surgical History:  Procedure Laterality Date   WISDOM TOOTH EXTRACTION      OB History    Gravida  1   Para  1   Term  1   Preterm      AB      Living  1     SAB      IAB      Ectopic      Multiple  0   Live Births  1            Home Medications    Prior to Admission medications   Medication Sig Start Date End Date Taking? Authorizing Provider  ibuprofen (ADVIL,MOTRIN) 800 MG tablet Take 1 tablet (800 mg total) by mouth every 8 (eight) hours as needed for moderate pain or cramping. 10/15/16   Christeen Douglas, MD  ferrous sulfate 325 (65 FE) MG tablet Take 1 tablet (325 mg total) by mouth daily with breakfast. Take with Vitamin C 10/15/16 02/03/20  Christeen Douglas, MD  fluticasone (FLONASE) 50 MCG/ACT nasal spray Place 2 sprays into both nostrils daily. 10/22/19 02/03/20  Moshe Cipro, NP    Family History Family History  Problem Relation Age of Onset   Hypertension Mother    Lung cancer Father    Bladder Cancer Neg Hx    Kidney cancer Neg Hx     Social History Social History   Tobacco Use   Smoking status: Never Smoker   Smokeless  tobacco: Never Used  Vaping Use   Vaping Use: Never used  Substance Use Topics   Alcohol use: Yes    Comment: occ   Drug use: No     Allergies   Patient has no known allergies.   Review of Systems Review of Systems   Physical Exam Triage Vital Signs ED Triage Vitals  Enc Vitals Group     BP 02/03/20 1117 118/87     Pulse Rate 02/03/20 1117 91     Resp 02/03/20 1117 18     Temp 02/03/20 1117 (!) 100.6 F (38.1 C)     Temp Source 02/03/20 1117 Oral     SpO2 02/03/20 1117 97 %     Weight --      Height --      Head Circumference --      Peak Flow --      Pain Score 02/03/20 1113 8     Pain Loc --      Pain Edu? --      Excl. in GC? --    No data found.  Updated Vital Signs BP 118/87 (BP Location: Left Arm)    Pulse 91    Temp (!) 100.6 F (38.1 C) (Oral)    Resp 18    LMP 02/01/2020    SpO2 97%   Visual Acuity Right Eye Distance:   Left Eye Distance:   Bilateral Distance:    Right Eye Near:   Left Eye Near:    Bilateral Near:     Physical Exam Vitals and nursing note reviewed.  Constitutional:      General: She is not in acute distress.    Appearance: Normal appearance. She is ill-appearing. She is not toxic-appearing or diaphoretic.  HENT:     Head: Normocephalic.     Nose: Nose normal.  Eyes:     Conjunctiva/sclera: Conjunctivae normal.  Pulmonary:     Effort: Pulmonary effort is normal.  Musculoskeletal:        General: Normal range of motion.     Cervical back: Normal range of motion.  Skin:    General: Skin is warm and dry.     Findings: No rash.  Neurological:     Mental Status: She is alert.  Psychiatric:        Mood and Affect: Mood normal.      UC Treatments / Results  Labs (all labs ordered are listed, but only abnormal results are displayed) Labs Reviewed  NOVEL CORONAVIRUS, NAA    EKG   Radiology No results found.  Procedures Procedures (including critical care time)  Medications Ordered in UC Medications - No  data to display  Initial Impression / Assessment and Plan / UC Course  I have reviewed the triage vital signs and the nursing notes.  Pertinent labs & imaging results that were available during my care of the patient were reviewed by me and considered in my medical decision making (see chart for details).     Viral illness Most likely Covid based on the positive at home test. Send PCR off to confirm Recommend quarantine.  Over-the-counter medicine as needed.  Rest, hydrate. Follow up as needed for continued or worsening symptoms  Final Clinical Impressions(s) / UC Diagnoses   Final diagnoses:  Viral illness     Discharge Instructions     This is most likely Covid if your at home test was positive.  We will send a PCR test off for confirmation For your symptoms you can take over-the-counter medicine, cough and cold medication as needed Rest, stay hydrated Follow up as needed for continued or worsening symptoms    ED Prescriptions    None     PDMP not reviewed this encounter.   Janace Aris, NP 02/03/20 1148

## 2020-02-03 NOTE — Discharge Instructions (Addendum)
This is most likely Covid if your at home test was positive.  We will send a PCR test off for confirmation For your symptoms you can take over-the-counter medicine, cough and cold medication as needed Rest, stay hydrated Follow up as needed for continued or worsening symptoms

## 2020-02-05 LAB — NOVEL CORONAVIRUS, NAA: SARS-CoV-2, NAA: DETECTED — AB

## 2020-02-05 LAB — SARS-COV-2, NAA 2 DAY TAT

## 2020-06-15 ENCOUNTER — Other Ambulatory Visit: Payer: Self-pay

## 2020-06-15 ENCOUNTER — Emergency Department: Payer: Medicaid Other

## 2020-06-15 ENCOUNTER — Emergency Department
Admission: EM | Admit: 2020-06-15 | Discharge: 2020-06-15 | Disposition: A | Discharge status: Home / Self Care | Payer: Medicaid Other | Attending: Emergency Medicine | Admitting: Emergency Medicine

## 2020-06-15 DIAGNOSIS — J45909 Unspecified asthma, uncomplicated: Secondary | ICD-10-CM | POA: Insufficient documentation

## 2020-06-15 DIAGNOSIS — R0789 Other chest pain: Secondary | ICD-10-CM

## 2020-06-15 DIAGNOSIS — M940 Chondrocostal junction syndrome [Tietze]: Secondary | ICD-10-CM | POA: Diagnosis not present

## 2020-06-15 DIAGNOSIS — R079 Chest pain, unspecified: Secondary | ICD-10-CM | POA: Diagnosis present

## 2020-06-15 LAB — CBC
HCT: 38 % (ref 36.0–46.0)
Hemoglobin: 13.4 g/dL (ref 12.0–15.0)
MCH: 28.9 pg (ref 26.0–34.0)
MCHC: 35.3 g/dL (ref 30.0–36.0)
MCV: 82.1 fL (ref 80.0–100.0)
Platelets: 292 10*3/uL (ref 150–400)
RBC: 4.63 MIL/uL (ref 3.87–5.11)
RDW: 12.1 % (ref 11.5–15.5)
WBC: 7.7 10*3/uL (ref 4.0–10.5)
nRBC: 0 % (ref 0.0–0.2)

## 2020-06-15 LAB — BASIC METABOLIC PANEL
Anion gap: 12 (ref 5–15)
BUN: 13 mg/dL (ref 6–20)
CO2: 22 mmol/L (ref 22–32)
Calcium: 9.3 mg/dL (ref 8.9–10.3)
Chloride: 102 mmol/L (ref 98–111)
Creatinine, Ser: 0.51 mg/dL (ref 0.44–1.00)
GFR, Estimated: >60 mL/min (ref >60)
Glucose, Bld: 101 mg/dL — ABNORMAL HIGH (ref 70–99)
Potassium: 3.4 mmol/L — ABNORMAL LOW (ref 3.5–5.1)
Sodium: 136 mmol/L (ref 135–145)

## 2020-06-15 LAB — TROPONIN I (HIGH SENSITIVITY): Troponin I (High Sensitivity): 4 ng/L (ref <18)

## 2020-06-15 MED ORDER — PREDNISONE 10 MG (21) PO TBPK: ORAL_TABLET | ORAL | 0 refills | Status: DC

## 2020-06-15 NOTE — ED Provider Notes (Signed)
[facility] Emergency Department Provider Note  ____________________________________________  Time seen: Approximately 7:18 PM  I have reviewed the triage vital signs and the nursing notes.   HISTORY  Chief Complaint Chest Pain    HPI Lindsey Deleon is a 30 y.o. female with a history of asthma who comes ED complaining of right-sided chest pain that started yesterday, constant, waxing and waning, worse lying on the right side and lying down.  Better upright.  Not pleuritic, no shortness of breath, not exertional.  Moderate intensity.  Reproducible with palpation of the right anterior chest    Past Medical History:  Diagnosis Date  . Asthma    childhood asthma  . History of cyst of urethra 09/30/2016   current cyst     Patient Active Problem List   Diagnosis Date Noted  . Large for gestational age fetus affecting management of mother 10/13/2016  . Labor and delivery indication for care or intervention 10/08/2016  . Pregnancy 09/30/2016     Past Surgical History:  Procedure Laterality Date  . WISDOM TOOTH EXTRACTION       Prior to Admission medications   Medication Sig Start Date End Date Taking? Authorizing Provider  predniSONE (STERAPRED UNI-PAK 21 TAB) 10 MG (21) TBPK tablet 6 tablets on day 1, then 5 tablets on day 2, then 4 tablets on day 3, then 3 tablets on day 4, then 2 tablets on day 5, then 1 tablet on day 6. 06/15/20  Yes Sharman Cheek, MD  ibuprofen (ADVIL,MOTRIN) 800 MG tablet Take 1 tablet (800 mg total) by mouth every 8 (eight) hours as needed for moderate pain or cramping. 10/15/16   Christeen Douglas, MD  ferrous sulfate 325 (65 FE) MG tablet Take 1 tablet (325 mg total) by mouth daily with breakfast. Take with Vitamin C 10/15/16 02/03/20  Christeen Douglas, MD  fluticasone (FLONASE) 50 MCG/ACT nasal spray Place 2 sprays into both nostrils daily. 10/22/19 02/03/20  Moshe Cipro, NP     Allergies Patient has no known  allergies.   Family History  Problem Relation Age of Onset  . Hypertension Mother   . Lung cancer Father   . Bladder Cancer Neg Hx   . Kidney cancer Neg Hx     Social History Social History   Tobacco Use  . Smoking status: Never Smoker  . Smokeless tobacco: Never Used  Vaping Use  . Vaping Use: Never used  Substance Use Topics  . Alcohol use: Yes    Comment: occ  . Drug use: No    Review of Systems  Constitutional:   No fever or chills.  ENT:   No sore throat. No rhinorrhea. Cardiovascular:   No chest pain or syncope. Respiratory:   No dyspnea or cough. Gastrointestinal:   Negative for abdominal pain, vomiting and diarrhea.  Musculoskeletal: Chest wall pain as above All other systems reviewed and are negative except as documented above in ROS and HPI.  ____________________________________________   PHYSICAL EXAM:  VITAL SIGNS: ED Triage Vitals  Enc Vitals Group     BP 06/15/20 1659 (!) 152/94     Pulse Rate 06/15/20 1659 66     Resp 06/15/20 1659 18     Temp 06/15/20 1659 98.8 F (37.1 C)     Temp Source 06/15/20 1659 Oral     SpO2 06/15/20 1659 100 %     Weight 06/15/20 1700 200 lb (90.7 kg)     Height 06/15/20 1700 5\' 4"  (1.626 m)  Head Circumference --      Peak Flow --      Pain Score 06/15/20 1659 7     Pain Loc --      Pain Edu? --      Excl. in GC? --     Vital signs reviewed, nursing assessments reviewed.   Constitutional:   Alert and oriented. Non-toxic appearance. Eyes:   Conjunctivae are normal. EOMI. PERRL. ENT      Head:   Normocephalic and atraumatic.      Nose:   Wearing a mask.      Mouth/Throat:   Wearing a mask.      Neck:   No meningismus. Full ROM. Hematological/Lymphatic/Immunilogical:   No cervical lymphadenopathy. Cardiovascular:   RRR. Symmetric bilateral radial and DP pulses.  No murmurs. Cap refill less than 2 seconds. Respiratory:   Normal respiratory effort without tachypnea/retractions. Breath sounds are clear and  equal bilaterally. No wheezes/rales/rhonchi.  Musculoskeletal:   Normal range of motion in all extremities. No joint effusions.  No lower extremity tenderness.  No edema. Neurologic:   Normal speech and language.  Motor grossly intact. No acute focal neurologic deficits are appreciated.  Skin:    Skin is warm, dry and intact. No rash noted.  No petechiae, purpura, or bullae.  ____________________________________________    LABS (pertinent positives/negatives) (all labs ordered are listed, but only abnormal results are displayed) Labs Reviewed  BASIC METABOLIC PANEL - Abnormal; Notable for the following components:      Result Value   Potassium 3.4 (*)    Glucose, Bld 101 (*)    All other components within normal limits  CBC  POC URINE PREG, ED  TROPONIN I (HIGH SENSITIVITY)  TROPONIN I (HIGH SENSITIVITY)   ____________________________________________   EKG  Interpreted by me Normal sinus rhythm rate of 74, normal axis and intervals.  Poor R wave progression.  Normal ST segments and T waves.  ____________________________________________    RADIOLOGY  DG Chest 2 View  Result Date: 06/15/2020 CLINICAL DATA:  Chest pain. EXAM: CHEST - 2 VIEW COMPARISON:  None. FINDINGS: The cardiac silhouette, mediastinal and hilar contours are normal. The lungs are clear. No pleural effusions. No pneumothorax. The bony thorax is intact. IMPRESSION: No acute cardiopulmonary findings. Electronically Signed   By: Rudie Meyer M.D.   On: 06/15/2020 17:52    ____________________________________________   PROCEDURES Procedures  ____________________________________________    CLINICAL IMPRESSION / ASSESSMENT AND PLAN / ED COURSE  Medications ordered in the ED: Medications - No data to display  Pertinent labs & imaging results that were available during my care of the patient were reviewed by me and considered in my medical decision making (see chart for details).  Lindsey Deleon was  evaluated in Emergency Department on 06/15/2020 for the symptoms described in the history of present illness. She was evaluated in the context of the global COVID-19 pandemic, which necessitated consideration that the patient might be at risk for infection with the SARS-CoV-2 virus that causes COVID-19. Institutional protocols and algorithms that pertain to the evaluation of patients at risk for COVID-19 are in a state of rapid change based on information released by regulatory bodies including the CDC and federal and state organizations. These policies and algorithms were followed during the patient's care in the ED.   Patient presents with reproducible chest wall pain in the setting of COVID over the last week and a lot of coughing.  Likely chest wall strain, some evidence of costochondritis  as well.   Vital signs are normal, chest x-ray and EKG are normal.  Labs are normal.  Pain is noncardiac, troponins not indicated.    Considering the patient's symptoms, medical history, and physical examination today, I have low suspicion for ACS, PE, TAD, pneumothorax, carditis, mediastinitis, pneumonia, CHF, or sepsis.        ____________________________________________   FINAL CLINICAL IMPRESSION(S) / ED DIAGNOSES    Final diagnoses:  Chest wall pain  Costochondritis, acute     ED Discharge Orders         Ordered    predniSONE (STERAPRED UNI-PAK 21 TAB) 10 MG (21) TBPK tablet        06/15/20 1917          Portions of this note were generated with dragon dictation software. Dictation errors may occur despite best attempts at proofreading.   Sharman Cheek, MD 06/15/20 1921

## 2020-06-15 NOTE — ED Triage Notes (Signed)
Pt states that she was diagnosed with covid for the second time last Monday and cleared to go back to work today. Pt states last pm she started having some right sided chest pain, pt reports pain is intermittent and states the pain is worse with exertion

## 2020-06-15 NOTE — ED Notes (Signed)
Provided Dc instructions. Verbalized understanding.

## 2020-09-20 ENCOUNTER — Ambulatory Visit
Admission: EM | Admit: 2020-09-20 | Discharge: 2020-09-20 | Disposition: A | Discharge status: Home / Self Care | Payer: Medicaid Other | Attending: Emergency Medicine | Admitting: Emergency Medicine

## 2020-09-20 ENCOUNTER — Encounter: Payer: Self-pay | Admitting: Emergency Medicine

## 2020-09-20 DIAGNOSIS — B349 Viral infection, unspecified: Secondary | ICD-10-CM

## 2020-09-20 NOTE — Discharge Instructions (Addendum)
Your COVID test is pending.  You should self quarantine until the test result is back.    Take Tylenol or ibuprofen as needed for fever or discomfort.  Rest and keep yourself hydrated.    Follow-up with your primary care provider if your symptoms are not improving.     

## 2020-09-20 NOTE — ED Provider Notes (Signed)
Renaldo Fiddler    CSN: [id number] Arrival date & time: 09/20/20  0941      History   Chief Complaint Chief Complaint  Patient presents with   Nasal Congestion   Fever   Otalgia   Headache    HPI TYREE VANDRUFF is a 30 y.o. female.  Patient presents with 2-day history of fever, headache, nasal congestion, earache.  She reports nonproductive cough today.  She denies rash, sore throat, shortness of breath, vomiting, diarrhea, or other symptoms.  Patient took Sudafed for her symptoms today.  Her medical history includes childhood asthma.     The history is provided by the patient and medical records.   Past Medical History:  Diagnosis Date   Asthma    childhood asthma   History of cyst of urethra 09/30/2016   current cyst    Patient Active Problem List   Diagnosis Date Noted   Large for gestational age fetus affecting management of mother 10/13/2016   Labor and delivery indication for care or intervention 10/08/2016   Pregnancy 09/30/2016    Past Surgical History:  Procedure Laterality Date   WISDOM TOOTH EXTRACTION      OB History     Gravida  1   Para  1   Term  1   Preterm      AB      Living  1      SAB      IAB      Ectopic      Multiple  0   Live Births  1            Home Medications    Prior to Admission medications   Medication Sig Start Date End Date Taking? Authorizing Provider  ibuprofen (ADVIL,MOTRIN) 800 MG tablet Take 1 tablet (800 mg total) by mouth every 8 (eight) hours as needed for moderate pain or cramping. 10/15/16   Christeen Douglas, MD  predniSONE (STERAPRED UNI-PAK 21 TAB) 10 MG (21) TBPK tablet 6 tablets on day 1, then 5 tablets on day 2, then 4 tablets on day 3, then 3 tablets on day 4, then 2 tablets on day 5, then 1 tablet on day 6. 06/15/20   Sharman Cheek, MD  ferrous sulfate 325 (65 FE) MG tablet Take 1 tablet (325 mg total) by mouth daily with breakfast. Take with Vitamin C 10/15/16 02/03/20   Christeen Douglas, MD  fluticasone (FLONASE) 50 MCG/ACT nasal spray Place 2 sprays into both nostrils daily. 10/22/19 02/03/20  Moshe Cipro, NP    Family History Family History  Problem Relation Age of Onset   Hypertension Mother    Lung cancer Father    Bladder Cancer Neg Hx    Kidney cancer Neg Hx     Social History Social History   Tobacco Use   Smoking status: Never   Smokeless tobacco: Never  Vaping Use   Vaping Use: Never used  Substance Use Topics   Alcohol use: Yes    Comment: occ   Drug use: No     Allergies   Patient has no known allergies.   Review of Systems Review of Systems  Constitutional:  Positive for fever. Negative for chills.  HENT:  Positive for congestion and ear pain. Negative for sore throat.   Respiratory:  Positive for cough. Negative for shortness of breath.   Cardiovascular:  Negative for chest pain and palpitations.  Gastrointestinal:  Negative for abdominal pain, diarrhea and vomiting.  Skin:  Negative for color change and rash.  Neurological:  Positive for headaches. Negative for syncope.  All other systems reviewed and are negative.   Physical Exam Triage Vital Signs ED Triage Vitals  Enc Vitals Group     BP      Pulse      Resp      Temp      Temp src      SpO2      Weight      Height      Head Circumference      Peak Flow      Pain Score      Pain Loc      Pain Edu?      Excl. in GC?    No data found.  Updated Vital Signs BP 102/72   Pulse (!) 102   Temp 98.4 F (36.9 C) (Oral)   Resp 20   SpO2 98%   Visual Acuity Right Eye Distance:   Left Eye Distance:   Bilateral Distance:    Right Eye Near:   Left Eye Near:    Bilateral Near:     Physical Exam Vitals and nursing note reviewed.  Constitutional:      General: She is not in acute distress.    Appearance: She is well-developed. She is not ill-appearing.  HENT:     Head: Normocephalic and atraumatic.     Right Ear: Tympanic membrane normal.      Left Ear: Tympanic membrane normal.     Nose: Congestion and rhinorrhea present.     Mouth/Throat:     Mouth: Mucous membranes are moist.     Pharynx: Oropharynx is clear.  Eyes:     Conjunctiva/sclera: Conjunctivae normal.  Cardiovascular:     Rate and Rhythm: Normal rate and regular rhythm.     Heart sounds: Normal heart sounds.  Pulmonary:     Effort: Pulmonary effort is normal. No respiratory distress.     Breath sounds: Normal breath sounds.  Abdominal:     Palpations: Abdomen is soft.     Tenderness: There is no abdominal tenderness.  Musculoskeletal:     Cervical back: Neck supple.  Skin:    General: Skin is warm and dry.  Neurological:     General: No focal deficit present.     Mental Status: She is alert and oriented to person, place, and time.     Gait: Gait normal.  Psychiatric:        Mood and Affect: Mood normal.        Behavior: Behavior normal.     UC Treatments / Results  Labs (all labs ordered are listed, but only abnormal results are displayed) Labs Reviewed  NOVEL CORONAVIRUS, NAA    EKG   Radiology No results found.  Procedures Procedures (including critical care time)  Medications Ordered in UC Medications - No data to display  Initial Impression / Assessment and Plan / UC Course  I have reviewed the triage vital signs and the nursing notes.  Pertinent labs & imaging results that were available during my care of the patient were reviewed by me and considered in my medical decision making (see chart for details).   Viral illness.  COVID pending.  Instructed patient to self quarantine per CDC guidelines.  Discussed symptomatic treatment including Tylenol or ibuprofen, rest, hydration.  Instructed patient to follow up with PCP if symptoms are not improving.  Patient agrees to plan of care.    Final  Clinical Impressions(s) / UC Diagnoses   Final diagnoses:  Viral illness     Discharge Instructions      Your COVID test is  pending.  You should self quarantine until the test result is back.    Take Tylenol or ibuprofen as needed for fever or discomfort.  Rest and keep yourself hydrated.    Follow-up with your primary care provider if your symptoms are not improving.         ED Prescriptions   None    PDMP not reviewed this encounter.   Mickie Bail, NP 09/20/20 1022

## 2020-09-20 NOTE — ED Triage Notes (Signed)
Pt here with nasal congestion, fever, otalgia in both ears, and headache x 2 days. Fever broke last night. Works in [employer].

## 2020-09-21 LAB — SARS-COV-2, NAA 2 DAY TAT

## 2020-09-21 LAB — NOVEL CORONAVIRUS, NAA: SARS-CoV-2, NAA: DETECTED — AB

## 2020-12-22 ENCOUNTER — Encounter: Payer: Self-pay | Admitting: Emergency Medicine

## 2020-12-22 ENCOUNTER — Ambulatory Visit
Admission: EM | Admit: 2020-12-22 | Discharge: 2020-12-22 | Disposition: A | Discharge status: Home / Self Care | Payer: Medicaid Other | Attending: Emergency Medicine | Admitting: Emergency Medicine

## 2020-12-22 ENCOUNTER — Other Ambulatory Visit: Payer: Self-pay

## 2020-12-22 DIAGNOSIS — B349 Viral infection, unspecified: Secondary | ICD-10-CM | POA: Diagnosis not present

## 2020-12-22 LAB — POCT INFLUENZA A/B
Influenza A, POC: NEGATIVE
Influenza B, POC: NEGATIVE

## 2020-12-22 NOTE — ED Triage Notes (Signed)
Pt here with chills, bodyaches, and congestion x 2 days.

## 2020-12-22 NOTE — Discharge Instructions (Addendum)
Your flu test is negative.  Your COVID test is pending.  You should self quarantine until the test result is back.   ° °Take Tylenol or ibuprofen as needed for fever or discomfort.  Rest and keep yourself hydrated.   ° °Follow-up with your primary care provider if your symptoms are not improving.   ° ° °

## 2020-12-22 NOTE — ED Provider Notes (Signed)
Renaldo Fiddler    CSN: [id number] Arrival date & time: 12/22/20  1825      History   Chief Complaint Chief Complaint  Patient presents with   Nasal Congestion   Fever   Generalized Body Aches    HPI Lindsey Deleon is a 30 y.o. female.  Patient presents with low-grade fever, chills, body aches, congestion, runny nose, postnasal drip, sore throat, cough since yesterday.  Treatment at home with Excedrin.  She denies rash, shortness of breath, vomiting, diarrhea, or other symptoms.  She has a history of asthma as a child.  The history is provided by the patient.   Past Medical History:  Diagnosis Date   Asthma    childhood asthma   History of cyst of urethra 09/30/2016   current cyst    Patient Active Problem List   Diagnosis Date Noted   Large for gestational age fetus affecting management of mother 10/13/2016   Labor and delivery indication for care or intervention 10/08/2016   Pregnancy 09/30/2016    Past Surgical History:  Procedure Laterality Date   WISDOM TOOTH EXTRACTION      OB History     Gravida  1   Para  1   Term  1   Preterm      AB      Living  1      SAB      IAB      Ectopic      Multiple  0   Live Births  1            Home Medications    Prior to Admission medications   Medication Sig Start Date End Date Taking? Authorizing Provider  ibuprofen (ADVIL,MOTRIN) 800 MG tablet Take 1 tablet (800 mg total) by mouth every 8 (eight) hours as needed for moderate pain or cramping. 10/15/16   Christeen Douglas, MD  predniSONE (STERAPRED UNI-PAK 21 TAB) 10 MG (21) TBPK tablet 6 tablets on day 1, then 5 tablets on day 2, then 4 tablets on day 3, then 3 tablets on day 4, then 2 tablets on day 5, then 1 tablet on day 6. 06/15/20   Sharman Cheek, MD  ferrous sulfate 325 (65 FE) MG tablet Take 1 tablet (325 mg total) by mouth daily with breakfast. Take with Vitamin C 10/15/16 02/03/20  Christeen Douglas, MD  fluticasone (FLONASE)  50 MCG/ACT nasal spray Place 2 sprays into both nostrils daily. 10/22/19 02/03/20  Moshe Cipro, NP    Family History Family History  Problem Relation Age of Onset   Hypertension Mother    Lung cancer Father    Bladder Cancer Neg Hx    Kidney cancer Neg Hx     Social History Social History   Tobacco Use   Smoking status: Never   Smokeless tobacco: Never  Vaping Use   Vaping Use: Never used  Substance Use Topics   Alcohol use: Yes    Comment: occ   Drug use: No     Allergies   Patient has no known allergies.   Review of Systems Review of Systems  Constitutional:  Positive for chills and fever.  HENT:  Positive for congestion, postnasal drip, rhinorrhea and sore throat. Negative for ear pain.   Respiratory:  Positive for cough. Negative for shortness of breath.   Cardiovascular:  Negative for chest pain and palpitations.  Gastrointestinal:  Negative for diarrhea and vomiting.  Skin:  Negative for color change and rash.  All other systems reviewed and are negative.   Physical Exam Triage Vital Signs ED Triage Vitals  Enc Vitals Group     BP      Pulse      Resp      Temp      Temp src      SpO2      Weight      Height      Head Circumference      Peak Flow      Pain Score      Pain Loc      Pain Edu?      Excl. in GC?    No data found.  Updated Vital Signs BP 124/86 (BP Location: Left Arm)   Pulse 78   Temp 98.9 F (37.2 C) (Oral)   Resp 16   SpO2 97%   Visual Acuity Right Eye Distance:   Left Eye Distance:   Bilateral Distance:    Right Eye Near:   Left Eye Near:    Bilateral Near:     Physical Exam Vitals and nursing note reviewed.  Constitutional:      General: She is not in acute distress.    Appearance: She is well-developed.  HENT:     Head: Normocephalic and atraumatic.     Right Ear: Tympanic membrane normal.     Left Ear: Tympanic membrane normal.     Nose: Congestion and rhinorrhea present.     Mouth/Throat:      Mouth: Mucous membranes are moist.     Pharynx: Oropharynx is clear.  Eyes:     Conjunctiva/sclera: Conjunctivae normal.  Cardiovascular:     Rate and Rhythm: Normal rate and regular rhythm.     Heart sounds: Normal heart sounds.  Pulmonary:     Effort: Pulmonary effort is normal. No respiratory distress.     Breath sounds: Normal breath sounds.  Abdominal:     Palpations: Abdomen is soft.     Tenderness: There is no abdominal tenderness.  Musculoskeletal:     Cervical back: Neck supple.  Skin:    General: Skin is warm and dry.  Neurological:     Mental Status: She is alert.  Psychiatric:        Mood and Affect: Mood normal.        Behavior: Behavior normal.     UC Treatments / Results  Labs (all labs ordered are listed, but only abnormal results are displayed) Labs Reviewed  POCT INFLUENZA A/B - Normal  NOVEL CORONAVIRUS, NAA    EKG   Radiology No results found.  Procedures Procedures (including critical care time)  Medications Ordered in UC Medications - No data to display  Initial Impression / Assessment and Plan / UC Course  I have reviewed the triage vital signs and the nursing notes.  Pertinent labs & imaging results that were available during my care of the patient were reviewed by me and considered in my medical decision making (see chart for details).   Viral illness. Rapid flu negative.  COVID pending.  Instructed patient to self quarantine per CDC guidelines.  Discussed symptomatic treatment including Tylenol or ibuprofen, rest, hydration.  Instructed patient to follow up with PCP if symptoms are not improving.  Patient agrees to plan of care.    Final Clinical Impressions(s) / UC Diagnoses   Final diagnoses:  Viral illness     Discharge Instructions      Your flu test is negative.  Your COVID  test is pending.  You should self quarantine until the test result is back.    Take Tylenol or ibuprofen as needed for fever or discomfort.  Rest and  keep yourself hydrated.    Follow-up with your primary care provider if your symptoms are not improving.         ED Prescriptions   None    PDMP not reviewed this encounter.   Mickie Bail, NP 12/22/20 1943

## 2020-12-24 LAB — SARS-COV-2, NAA 2 DAY TAT

## 2020-12-24 LAB — NOVEL CORONAVIRUS, NAA: SARS-CoV-2, NAA: NOT DETECTED

## 2021-03-24 ENCOUNTER — Ambulatory Visit (LOCAL_COMMUNITY_HEALTH_CENTER): Payer: Medicaid Other | Admitting: Advanced Practice Midwife

## 2021-03-24 ENCOUNTER — Telehealth: Payer: Self-pay

## 2021-03-24 ENCOUNTER — Other Ambulatory Visit: Payer: Self-pay

## 2021-03-24 VITALS — BP 125/59 | HR 67 | Temp 97.8°F | Resp 18 | Ht 65.0 in | Wt 203.0 lb

## 2021-03-24 DIAGNOSIS — Z3009 Encounter for other general counseling and advice on contraception: Secondary | ICD-10-CM

## 2021-03-24 DIAGNOSIS — R87612 Low grade squamous intraepithelial lesion on cytologic smear of cervix (LGSIL): Secondary | ICD-10-CM

## 2021-03-24 DIAGNOSIS — E669 Obesity, unspecified: Secondary | ICD-10-CM

## 2021-03-24 DIAGNOSIS — Z823 Family history of stroke: Secondary | ICD-10-CM

## 2021-03-24 DIAGNOSIS — Z30016 Encounter for initial prescription of transdermal patch hormonal contraceptive device: Secondary | ICD-10-CM | POA: Diagnosis not present

## 2021-03-24 DIAGNOSIS — R87619 Unspecified abnormal cytological findings in specimens from cervix uteri: Secondary | ICD-10-CM | POA: Insufficient documentation

## 2021-03-24 LAB — WET PREP FOR TRICH, YEAST, CLUE
Trichomonas Exam: NEGATIVE
Yeast Exam: NEGATIVE

## 2021-03-24 MED ORDER — NORETHINDRONE 0.35 MG PO TABS: 1.0000 | ORAL_TABLET | Freq: Every day | ORAL | 2 refills | Status: DC

## 2021-03-24 NOTE — Telephone Encounter (Signed)
T/C to pt and voicemail message left that Dr. Alvester Morin agreed that either Micronor or DMPA would be safer for pt because of estrogen avoidance. Informed that she can call back and discuss further if desires or change to DMPA

## 2021-03-24 NOTE — Progress Notes (Signed)
Dispensed to patient 3 packs of Microner per Darrol Poke, CNM order.   Education given. Patient not sexually active but wishes birthcontrol "to not have period while on cruise" scheduled for 03/18/21. Informed patient to take pill everyday starting 04/04/21 as she is to sexually active and does not plan to be and that will insure hopefully no menses while on cruise dates.   Wet mount reviewed during clinic visit -  no treatment indicated.   Adalberto Cole, RN

## 2021-03-24 NOTE — Progress Notes (Signed)
[facility] DEPARTMENT [facility] [address] Main Number: [phone]    Family Planning Visit- Initial Visit  Subjective:  Lindsey Deleon is a 31 y.o. SWF nonsmoker [id number] (4 yo son)  being seen today for an initial annual visit and to discuss reproductive life planning.  The patient is currently using Abstinence for pregnancy prevention. Patient reports   does not want a pregnancy in the next year.  Patient has the following medical conditions has Labor and delivery indication for care or intervention; Large for gestational age fetus affecting management of mother; Obesity BMI=33.7; and Family history stroke x2 in 1/2 sister at age 62 on their problem list.  Chief Complaint  Patient presents with   Annual Exam   Contraception    Patient reports here for physical, pap, birth control. Wants patch. LMP 03/19/21. Last sex 2021. Last MJ 4 years ago. Last ETOH 03/21/21 (2 Margaritas) q 2 mo. Last pap 11/25/16 LGSIL with cells suspicious for HGSIL are present. Colpo at [facility] 03/08/17 with LSIL and no f/u since then. Last PE 11/25/16. Last dental exam 2021. Going on a cruise 04/15/21 and wants birth control so she doesn't have a period. Reports family history of 1/2 sister (same mom) had 2 strokes (blood clot in her brain) at the age of 5 (10/2020)  several days apart while on DMPA.  Also m. Aunt deceased from breast cancer and that aunt's daughter deceased from breast cancer.  Pt c/o 10-12 h/a/month relieved with Excedrin, -N&V, -vision, -neuro, -audio. Employed 40 hrs/wk and living with her son.  Boyfriend is in jail but will be getting out soon so wants good birth control.   Patient denies cigs, vaping, cigar  Body mass index is 33.78 kg/m. - Patient is eligible for diabetes screening based on BMI and age >13?  not applicable HA1C ordered? not applicable  Patient reports 0  partner/s in last year. Desires STI screening?  No - declines bloodwork  Has patient  been screened once for HCV in the past?  No  No results found for: HCVAB  Does the patient have current drug use (including MJ), have a partner with drug use, and/or has been incarcerated since last result? No  If yes-- Screen for HCV through [facility]   Does the patient meet criteria for HBV testing? No  Criteria:  -Household, sexual or needle sharing contact with HBV -History of drug use -HIV positive -Those with known Hep C   Health Maintenance Due  Topic Date Due   COVID-19 Vaccine (1) Never done   HIV Screening  Never done   Hepatitis C Screening  Never done   PAP-Cervical Cytology Screening  Never done   PAP SMEAR-Modifier  Never done   INFLUENZA VACCINE  Never done    Review of Systems  HENT:  Positive for sore throat (dx'd with strep 03/22/21 and on antibiotics).   Neurological:  Positive for headaches (10-12x/mo always on forehead relieved with Excedrin, -N&V,-neuro,-vision,-audio).  All other systems reviewed and are negative.  The following portions of the patient's history were reviewed and updated as appropriate: allergies, current medications, past family history, past medical history, past social history, past surgical history and problem list. Problem list updated.   See flowsheet for other program required questions.  Objective:   Vitals:   03/24/21 1118  BP: (!) 125/59  Pulse: 67  Resp: 18  Temp: 97.8 F (36.6 C)  Weight: 203 lb (92.1 kg)  Height:  5\' 5"  (1.651 m)    Physical Exam Constitutional:      Appearance: Normal appearance. She is obese.  HENT:     Head: Normocephalic and atraumatic.     Mouth/Throat:     Mouth: Mucous membranes are moist.     Comments: Last dental exam 2021 Eyes:     Conjunctiva/sclera: Conjunctivae normal.  Neck:     Thyroid: No thyroid mass, thyromegaly or thyroid tenderness.  Cardiovascular:     Rate and Rhythm: Normal rate and regular rhythm.  Pulmonary:     Effort: Pulmonary effort is normal.     Breath  sounds: Normal breath sounds.  Chest:  Breasts:    Right: Normal.     Left: Normal.  Abdominal:     Palpations: Abdomen is soft.     Comments: Soft without masses or tenderness, poor tone, increased adipose  Genitourinary:    General: Normal vulva.     Exam position: Lithotomy position.     Vagina: Vaginal discharge (white creamy leukorrhea, ph< 4.5) present.     Cervix: Normal.     Uterus: Normal.      Adnexa: Right adnexa normal and left adnexa normal.     Rectum: Normal.  Musculoskeletal:        General: Normal range of motion.     Cervical back: Normal range of motion and neck supple.  Skin:    General: Skin is warm and dry.  Neurological:     Mental Status: She is alert.  Psychiatric:        Mood and Affect: Mood normal.      Assessment and Plan:  Lindsey Deleon is a 31 y.o. female presenting to the [facility] Department for an initial annual wellness/contraceptive visit  Contraception counseling: Reviewed all forms of birth control options in the tiered based approach. available including abstinence; over the counter/barrier methods; hormonal contraceptive medication including pill, patch, ring, injection,contraceptive implant, ECP; hormonal and nonhormonal IUDs; permanent sterilization options including vasectomy and the various tubal sterilization modalities. Risks, benefits, and typical effectiveness rates were reviewed.  Questions were answered.  Written information was also given to the patient to review.  Patient desires Oral Contraceptive, this was prescribed for patient.    The patient will follow up in  2-3 months for surveillance.  The patient was told to call with any further questions, or with any concerns about this method of contraception.  Emphasized use of condoms 100% of the time for STI prevention.  Patient was not offered ECP based on not meeting criteria. ECP was not accepted by the patient. ECP counseling was not given - see RN  documentation  1. Obesity, unspecified classification, unspecified obesity type, unspecified whether serious comorbidity present   2. Family planning Pt counseled extensively on her BMI, family history of sister with 2 strokes at age 11, and pt's c/o h/a 10-12x/mo always on forehead and relieved with Excedrin and that avoidance of estrogen was in her best interest. Pt agrees to Micronor #3. Consulted with Dr. 22 after pt left building, and DMPA or Micronor safest for this pt. T/C message left for pt to discuss - IGP, Aptima HPV - Chlamydia/Gonorrhea [facility] - WET PREP FOR TRICH, YEAST, CLUE  4. Family history stroke in 1/2 sister at age 56 x2      No follow-ups on file.  No future appointments.  22, CNM

## 2021-03-31 LAB — IGP, APTIMA HPV
HPV Aptima: NEGATIVE
PAP Smear Comment: 0

## 2021-05-21 ENCOUNTER — Telehealth: Payer: Self-pay | Admitting: Surgery

## 2021-05-21 NOTE — Telephone Encounter (Signed)
Lindsey Deleon called RN at [employer]. There are no known active TB cases. Also, when inmates are transferred they do not get TB tests, they get COVID tests.  ? ?Encouraged patient when we last spoke to find out more about relative's test, was it a TB test, was it positive, did she get a CXR?  ? ?Texted pt about [place] jail info, no active cases, and that if relative does have a positive TB test, once we get documentation, we can get her a free CXR. ? ?If the patient or her family members are concerned, gave her appt line [phone], and info about cost of TB skin test versus blood test/QFT. Informed her offices will be open again on Monday. ? ?Jennye Moccasin, MD  ?

## 2021-05-21 NOTE — Telephone Encounter (Signed)
Patient said her aunt tested positive for TB, was worried she was exposed, did she need testing? Explained difference between active and latent TB, and she should find out if her relative has active TB. If yes, she'll need testing. ?  ?Pt informed can get skin test or blood test at her PCP office or local health department.  ?  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 Apparently, patient in question, who's son is in [facility], had a positive TB test. Unclear if latent or active. We have not been notified of any new active cases in the county at this time. ?  ?But encouraged patient to find out details and if relative has active disease, to get testing setup, and to wear a mask, minimize contact with others until we know more. ? ?Will see if any new active cases reported. ?  ?Jennye Moccasin, MD  ?

## 2021-11-08 ENCOUNTER — Telehealth: Payer: Self-pay | Admitting: Family Medicine

## 2021-11-08 NOTE — Telephone Encounter (Signed)
Pt calls & has questions re: the morning after pill. She is at work & is requesting to be called at either 12-1pm or 3pm. Please advise.

## 2021-11-09 NOTE — Telephone Encounter (Signed)
LM for pt to return my call

## 2022-01-16 ENCOUNTER — Other Ambulatory Visit: Payer: Self-pay | Admitting: Obstetrics & Gynecology

## 2022-02-07 NOTE — L&D Delivery Note (Signed)
Delivery Note At 1:09 PM a viable female was delivered via Vaginal, Spontaneous (Presentation: Left Occiput Anterior).  APGAR: ,8/9 ; weight 9 lb 8.4 oz (4320 g).   Placenta status: Spontaneous, Intact.  Cord: 3 vessels with the following complications: None.  Cord pH: n/a Pt progressed quickly from 2 cm->> complete and complete Pt pushed for 5 minutes and after delivery of head anterior shoulder was delivered without difficulty . Large vigorous female  was placed on mother's Abd and delayed cord clamping for 60 sec . Marland Kitchen Iv spontaneously fell out and after 10 minutes and Placenta delivery 10 units IM Pitocin given  and 0.2 mg Methergine IM  administered . Second degree repair performed and crede performed with 75 cc clots .Marland Kitchen New IV placed and IV pitocin to be administered   Anesthesia: None Episiotomy: None Lacerations:  second degree Suture Repair: 2.0 3.0 vicryl Est. Blood Loss (mL):  qbl 475 cc  Mom to postpartum.  Baby to Couplet care / Skin to Skin.  Lindsey Deleon 07/20/2022, 1:36 PM

## 2022-03-06 ENCOUNTER — Other Ambulatory Visit: Payer: Self-pay

## 2022-03-06 ENCOUNTER — Emergency Department
Admission: EM | Admit: 2022-03-06 | Discharge: 2022-03-06 | Disposition: A | Discharge status: Home / Self Care | Payer: Medicaid Other | Attending: Emergency Medicine | Admitting: Emergency Medicine

## 2022-03-06 DIAGNOSIS — E860 Dehydration: Secondary | ICD-10-CM | POA: Insufficient documentation

## 2022-03-06 DIAGNOSIS — O99892 Other specified diseases and conditions complicating childbirth: Secondary | ICD-10-CM | POA: Insufficient documentation

## 2022-03-06 DIAGNOSIS — O219 Vomiting of pregnancy, unspecified: Secondary | ICD-10-CM | POA: Insufficient documentation

## 2022-03-06 DIAGNOSIS — Z1152 Encounter for screening for COVID-19: Secondary | ICD-10-CM | POA: Diagnosis not present

## 2022-03-06 DIAGNOSIS — J45909 Unspecified asthma, uncomplicated: Secondary | ICD-10-CM | POA: Insufficient documentation

## 2022-03-06 DIAGNOSIS — Z3A18 18 weeks gestation of pregnancy: Secondary | ICD-10-CM | POA: Insufficient documentation

## 2022-03-06 LAB — CBC
HCT: 31.8 % — ABNORMAL LOW (ref 36.0–46.0)
Hemoglobin: 10.8 g/dL — ABNORMAL LOW (ref 12.0–15.0)
MCH: 29.3 pg (ref 26.0–34.0)
MCHC: 34 g/dL (ref 30.0–36.0)
MCV: 86.2 fL (ref 80.0–100.0)
Platelets: 195 10*3/uL (ref 150–400)
RBC: 3.69 MIL/uL — ABNORMAL LOW (ref 3.87–5.11)
RDW: 13.2 % (ref 11.5–15.5)
WBC: 7.2 10*3/uL (ref 4.0–10.5)
nRBC: 0 % (ref 0.0–0.2)

## 2022-03-06 LAB — URINALYSIS, ROUTINE W REFLEX MICROSCOPIC
Bilirubin Urine: NEGATIVE
Glucose, UA: NEGATIVE mg/dL
Hgb urine dipstick: NEGATIVE
Ketones, ur: 80 mg/dL — AB
Leukocytes,Ua: NEGATIVE
Nitrite: NEGATIVE
Protein, ur: NEGATIVE mg/dL
Specific Gravity, Urine: 1.02 (ref 1.005–1.030)
pH: 8 (ref 5.0–8.0)

## 2022-03-06 LAB — COMPREHENSIVE METABOLIC PANEL
ALT: 29 U/L (ref 0–44)
AST: 35 U/L (ref 15–41)
Albumin: 3.7 g/dL (ref 3.5–5.0)
Alkaline Phosphatase: 47 U/L (ref 38–126)
Anion gap: 10 (ref 5–15)
BUN: <5 mg/dL — ABNORMAL LOW (ref 6–20)
CO2: 19 mmol/L — ABNORMAL LOW (ref 22–32)
Calcium: 8.8 mg/dL — ABNORMAL LOW (ref 8.9–10.3)
Chloride: 105 mmol/L (ref 98–111)
Creatinine, Ser: 0.38 mg/dL — ABNORMAL LOW (ref 0.44–1.00)
GFR, Estimated: >60 mL/min (ref >60)
Glucose, Bld: 95 mg/dL (ref 70–99)
Potassium: 3.3 mmol/L — ABNORMAL LOW (ref 3.5–5.1)
Sodium: 134 mmol/L — ABNORMAL LOW (ref 135–145)
Total Bilirubin: 0.8 mg/dL (ref 0.3–1.2)
Total Protein: 7.1 g/dL (ref 6.5–8.1)

## 2022-03-06 LAB — RESP PANEL BY RT-PCR (RSV, FLU A&B, COVID)  RVPGX2
Influenza A by PCR: NEGATIVE
Influenza B by PCR: NEGATIVE
Resp Syncytial Virus by PCR: NEGATIVE
SARS Coronavirus 2 by RT PCR: NEGATIVE

## 2022-03-06 LAB — LIPASE, BLOOD: Lipase: 28 U/L (ref 11–51)

## 2022-03-06 MED ORDER — DEXTROSE 5 % AND 0.9 % NACL IV BOLUS
1000.0000 mL | Freq: Once | INTRAVENOUS | Status: AC
  Administered 2022-03-06: 1000 mL via INTRAVENOUS
  Filled 2022-03-06: qty 1000

## 2022-03-06 MED ORDER — ALUMINUM-MAGNESIUM-SIMETHICONE 200-200-20 MG/5ML PO SUSP: 30.0000 mL | Freq: Three times a day (TID) | ORAL | 2 refills | Status: DC

## 2022-03-06 MED ORDER — DIPHENHYDRAMINE HCL 25 MG PO CAPS: 50.0000 mg | ORAL_CAPSULE | Freq: Four times a day (QID) | ORAL | 0 refills | Status: DC | PRN

## 2022-03-06 MED ORDER — METOCLOPRAMIDE HCL 5 MG/ML IJ SOLN
10.0000 mg | INTRAMUSCULAR | Status: AC
  Administered 2022-03-06: 10 mg via INTRAVENOUS
  Filled 2022-03-06: qty 2

## 2022-03-06 MED ORDER — DEXTROSE 5 % AND 0.9 % NACL IV BOLUS: 1000.0000 mL | Freq: Once | INTRAVENOUS | Status: DC

## 2022-03-06 MED ORDER — METOCLOPRAMIDE HCL 10 MG PO TABS: 10.0000 mg | ORAL_TABLET | Freq: Four times a day (QID) | ORAL | 0 refills | Status: DC | PRN

## 2022-03-06 MED ORDER — DIPHENHYDRAMINE HCL 50 MG/ML IJ SOLN
25.0000 mg | INTRAMUSCULAR | Status: AC
  Administered 2022-03-06: 25 mg via INTRAVENOUS
  Filled 2022-03-06: qty 1

## 2022-03-06 NOTE — ED Notes (Signed)
Patient able to tolerate PO fluids at this time.

## 2022-03-06 NOTE — ED Triage Notes (Signed)
Pt to ED from home for vomiting since 0900 this morning. Unable to keep anything down. Pt states she had a positive exposure to COVID on Tuesday. Pt is CAOx4 and in no acute distress. OBGYN is [facility] last visit went ok, due 6/25.

## 2022-03-06 NOTE — ED Provider Notes (Signed)
[facility] Provider Note    Event Date/Time   First MD Initiated Contact with Patient 03/06/22 2044     (approximate)   History   Chief Complaint: Emesis During Pregnancy (18wks)   HPI  Lindsey Deleon is a 32 y.o. female currently [redacted] weeks pregnant with history of asthma who complains of nausea vomiting fever and chills that started this morning, constant all day.  Had COVID exposure at work a few days ago.  No diarrhea chest pain shortness of breath.  No vaginal bleeding or leakage of fluid.  No contractions.  Normal fetal movements.  No complications during pregnancy.     Physical Exam   Triage Vital Signs: ED Triage Vitals  Enc Vitals Group     BP 03/06/22 2031 125/64     Pulse Rate 03/06/22 2031 (!) 122     Resp 03/06/22 2031 16     Temp 03/06/22 2031 100 F (37.8 C)     Temp Source 03/06/22 2031 Oral     SpO2 03/06/22 2031 98 %     Weight 03/06/22 2032 196 lb (88.9 kg)     Height 03/06/22 2032 5\' 5"  (1.651 m)     Head Circumference --      Peak Flow --      Pain Score 03/06/22 2032 0     Pain Loc --      Pain Edu? --      Excl. in [place]? --     Most recent vital signs: Vitals:   03/06/22 2031  BP: 125/64  Pulse: (!) 122  Resp: 16  Temp: 100 F (37.8 C)  SpO2: 98%    General: Awake, no distress.  CV:  Good peripheral perfusion.  Tachycardia heart rate 110 Resp:  Normal effort.  Clear to auscultation bilaterally Abd:  No distention.  Soft nontender.  Gravid, size consistent with dates Other:  No lower extremity edema.  Nontoxic   ED Results / Procedures / Treatments   Labs (all labs ordered are listed, but only abnormal results are displayed) Labs Reviewed  COMPREHENSIVE METABOLIC PANEL - Abnormal; Notable for the following components:      Result Value   Sodium 134 (*)    Potassium 3.3 (*)    CO2 19 (*)    BUN <5 (*)    Creatinine, Ser 0.38 (*)    Calcium 8.8 (*)    All other components within normal limits  CBC -  Abnormal; Notable for the following components:   RBC 3.69 (*)    Hemoglobin 10.8 (*)    HCT 31.8 (*)    All other components within normal limits  URINALYSIS, ROUTINE W REFLEX MICROSCOPIC - Abnormal; Notable for the following components:   Color, Urine YELLOW (*)    APPearance CLOUDY (*)    Ketones, ur 80 (*)    All other components within normal limits  RESP PANEL BY RT-PCR (RSV, FLU A&B, COVID)  RVPGX2  LIPASE, BLOOD     EKG    RADIOLOGY    PROCEDURES:  Procedures   MEDICATIONS ORDERED IN ED: Medications  metoCLOPramide (REGLAN) injection 10 mg (10 mg Intravenous Given 03/06/22 2130)  diphenhydrAMINE (BENADRYL) injection 25 mg (25 mg Intravenous Given 03/06/22 2127)  dextrose 5 % and 0.9% NaCl 5-0.9 % bolus 1,000 mL (1,000 mLs Intravenous New Bag/Given 03/06/22 2134)     IMPRESSION / MDM / ASSESSMENT AND PLAN / ED COURSE  I reviewed the triage vital signs and  the nursing notes.  DDx: Viral illness, dehydration, electrolyte abnormality, AKI, UTI  Patient's presentation is most consistent with acute presentation with potential threat to life or bodily function.  Patient presents with vomiting in second trimester pregnancy.  High suspicion for viral illness and dehydration.  Will give IV fluids, Reglan, Benadryl for symptom relief and hydration.   ----------------------------------------- 10:47 PM on 03/06/2022 ----------------------------------------- Feeling much better.  Tolerating oral intake.  Labs are reassuring other than some signs of dehydration.  Not requiring admission, stable for discharge with oral leave.  COVID flu negative.      FINAL CLINICAL IMPRESSION(S) / ED DIAGNOSES   Final diagnoses:  Nausea and vomiting in pregnancy prior to [redacted] weeks gestation  Dehydration     Rx / DC Orders   ED Discharge Orders          Ordered    metoCLOPramide (REGLAN) 10 MG tablet  Every 6 hours PRN        03/06/22 2246    diphenhydrAMINE (BENADRYL) 25 mg  capsule  Every 6 hours PRN        03/06/22 2246    aluminum-magnesium hydroxide-simethicone (MAALOX) 200-200-20 MG/5ML SUSP  3 times daily before meals & bedtime        03/06/22 2246             Note:  This document was prepared using Dragon voice recognition software and may include unintentional dictation errors.   Carrie Mew, MD 03/06/22 [phone]

## 2022-05-17 ENCOUNTER — Ambulatory Visit: Payer: Medicaid Other | Attending: Obstetrics

## 2022-05-17 ENCOUNTER — Other Ambulatory Visit: Payer: Self-pay

## 2022-05-17 DIAGNOSIS — N393 Stress incontinence (female) (male): Secondary | ICD-10-CM | POA: Diagnosis present

## 2022-05-17 DIAGNOSIS — M6281 Muscle weakness (generalized): Secondary | ICD-10-CM | POA: Diagnosis present

## 2022-05-17 DIAGNOSIS — R2689 Other abnormalities of gait and mobility: Secondary | ICD-10-CM | POA: Insufficient documentation

## 2022-05-17 DIAGNOSIS — R293 Abnormal posture: Secondary | ICD-10-CM | POA: Insufficient documentation
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
## 2022-05-17 NOTE — Patient Instructions (Signed)
TOILET POSTURE: Urination: feet flat, lean forward with forearms on legs to fully empty bladder. Bowel movement: place feet flat on Squatty Potty or stool so knees are higher than hips, lean forward to relax pelvic floor in order to avoid strain.  SHOES: wear sandals or shoes with heel strap for support. Wear crocs in sport mode.  MORNING: drink water first thing in the morning.

## 2022-05-25 ENCOUNTER — Other Ambulatory Visit: Payer: Self-pay

## 2022-05-25 ENCOUNTER — Ambulatory Visit: Payer: Medicaid Other

## 2022-05-25 DIAGNOSIS — N393 Stress incontinence (female) (male): Secondary | ICD-10-CM

## 2022-05-25 DIAGNOSIS — R293 Abnormal posture: Secondary | ICD-10-CM

## 2022-05-25 DIAGNOSIS — R2689 Other abnormalities of gait and mobility: Secondary | ICD-10-CM

## 2022-05-25 DIAGNOSIS — M6281 Muscle weakness (generalized): Secondary | ICD-10-CM
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
## 2022-06-01 ENCOUNTER — Other Ambulatory Visit: Payer: Self-pay

## 2022-06-01 ENCOUNTER — Ambulatory Visit: Payer: Medicaid Other

## 2022-06-01 DIAGNOSIS — N393 Stress incontinence (female) (male): Secondary | ICD-10-CM

## 2022-06-01 DIAGNOSIS — M6281 Muscle weakness (generalized): Secondary | ICD-10-CM

## 2022-06-01 DIAGNOSIS — R2689 Other abnormalities of gait and mobility: Secondary | ICD-10-CM

## 2022-06-01 DIAGNOSIS — R293 Abnormal posture: Secondary | ICD-10-CM
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
## 2022-06-08 ENCOUNTER — Other Ambulatory Visit: Payer: Self-pay

## 2022-06-08 ENCOUNTER — Ambulatory Visit: Payer: Medicaid Other | Attending: Obstetrics

## 2022-06-08 DIAGNOSIS — R2689 Other abnormalities of gait and mobility: Secondary | ICD-10-CM | POA: Insufficient documentation

## 2022-06-08 DIAGNOSIS — R293 Abnormal posture: Secondary | ICD-10-CM | POA: Insufficient documentation

## 2022-06-08 DIAGNOSIS — N393 Stress incontinence (female) (male): Secondary | ICD-10-CM | POA: Insufficient documentation

## 2022-06-08 DIAGNOSIS — M6281 Muscle weakness (generalized): Secondary | ICD-10-CM | POA: Diagnosis present
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
## 2022-06-15 ENCOUNTER — Ambulatory Visit: Payer: Medicaid Other

## 2022-06-18 ENCOUNTER — Encounter: Payer: Self-pay | Admitting: *Deleted

## 2022-06-18 ENCOUNTER — Observation Stay
Admission: EM | Admit: 2022-06-18 | Discharge: 2022-06-18 | Disposition: A | Discharge status: Home / Self Care | Payer: Medicaid Other | Attending: Obstetrics and Gynecology | Admitting: Obstetrics and Gynecology

## 2022-06-18 ENCOUNTER — Other Ambulatory Visit: Payer: Self-pay

## 2022-06-18 DIAGNOSIS — Z3A33 33 weeks gestation of pregnancy: Secondary | ICD-10-CM | POA: Insufficient documentation

## 2022-06-18 DIAGNOSIS — Z79899 Other long term (current) drug therapy: Secondary | ICD-10-CM | POA: Insufficient documentation

## 2022-06-18 DIAGNOSIS — J45909 Unspecified asthma, uncomplicated: Secondary | ICD-10-CM | POA: Insufficient documentation

## 2022-06-18 DIAGNOSIS — O23593 Infection of other part of genital tract in pregnancy, third trimester: Secondary | ICD-10-CM | POA: Diagnosis present

## 2022-06-18 DIAGNOSIS — O99513 Diseases of the respiratory system complicating pregnancy, third trimester: Secondary | ICD-10-CM | POA: Insufficient documentation

## 2022-06-18 DIAGNOSIS — N898 Other specified noninflammatory disorders of vagina: Secondary | ICD-10-CM | POA: Diagnosis present

## 2022-06-18 LAB — WET PREP, GENITAL
Clue Cells Wet Prep HPF POC: NONE SEEN
Sperm: NONE SEEN
Trich, Wet Prep: NONE SEEN
WBC, Wet Prep HPF POC: <10 (ref <10)
Yeast Wet Prep HPF POC: NONE SEEN

## 2022-06-18 LAB — CHLAMYDIA/NGC RT PCR (ARMC ONLY)
Chlamydia Tr: NOT DETECTED
N gonorrhoeae: NOT DETECTED

## 2022-06-18 LAB — RUPTURE OF MEMBRANE (ROM)PLUS: Rom Plus: NEGATIVE

## 2022-06-18 NOTE — OB Triage Note (Signed)
Patient arrived with complaints of possible leaking of fluid. Pt states her underwear have been damp all morning starting at 0400, but no large gush. Denies contractions, pain, bleeding, pt has thrown up since arriving but states that she has not feeling nauseated and sometimes does that. Pt states baby is moving well. Pt states they are monitoring her for LGA, Poly, and maternal BS. Monitors applied and assessing.

## 2022-06-18 NOTE — Discharge Instructions (Signed)
Drink plenty of water and get plenty of rest, Call your provider or return to the ER for any concerns. Keep you next scheduled OB appointment on Wed. 15th

## 2022-06-18 NOTE — Progress Notes (Signed)
Patient discharged home, discharge instructions given, patient states understanding. Patient left floor in stable condition, denies any other needs at this time. Patient to keep next scheduled OB appointment 
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
## 2022-06-22 ENCOUNTER — Ambulatory Visit: Payer: Medicaid Other

## 2022-06-22 ENCOUNTER — Other Ambulatory Visit: Payer: Self-pay

## 2022-06-22 DIAGNOSIS — N393 Stress incontinence (female) (male): Secondary | ICD-10-CM | POA: Diagnosis not present

## 2022-06-22 DIAGNOSIS — R293 Abnormal posture: Secondary | ICD-10-CM

## 2022-06-22 DIAGNOSIS — M6281 Muscle weakness (generalized): Secondary | ICD-10-CM

## 2022-06-22 DIAGNOSIS — R2689 Other abnormalities of gait and mobility: Secondary | ICD-10-CM

## 2022-06-22 NOTE — Patient Instructions (Signed)
LABOR AND DELIVERY PREP: - Pelvic Tilt on Swiss Ball  - 1 x daily - 7 x weekly - 1 sets - 10 reps - Seated Lateral Trunk Stretch on Swiss Ball  - 1 x daily - 7 x weekly - 1 sets - 10 reps - Seated Lateral Pelvic Tilt on Swiss Ball  - 1 x daily - 7 x weekly - 1 sets - 10 reps - Pelvic Circles on Swiss Ball  - 1 x daily - 7 x weekly - 1 sets - 10 reps - Slow Controlled Bounce on Swiss Ball  - 1 x daily - 7 x weekly - 1 sets - 10 reps - Diaphragmatic Breathing in Supported Child's Pose with Pelvic Floor Relaxation  - 1 x daily - 7 x weekly - 1 sets - 3 reps - 30-60 hold - Cat Cow  - 1 x daily - 7 x weekly - 1 sets - 10 reps - Standing Fallout with Swiss Ball at Table  - 1 x daily - 7 x weekly - 1 sets - 10 reps
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
## 2022-06-29 ENCOUNTER — Ambulatory Visit: Payer: Medicaid Other

## 2022-07-04 ENCOUNTER — Other Ambulatory Visit: Payer: Self-pay | Admitting: Obstetrics & Gynecology

## 2022-07-06 ENCOUNTER — Other Ambulatory Visit: Payer: Self-pay | Admitting: Obstetrics and Gynecology

## 2022-07-06 ENCOUNTER — Ambulatory Visit: Payer: Medicaid Other

## 2022-07-06 DIAGNOSIS — Z01818 Encounter for other preprocedural examination: Secondary | ICD-10-CM

## 2022-07-13 ENCOUNTER — Ambulatory Visit: Payer: Medicaid Other

## 2022-07-20 ENCOUNTER — Other Ambulatory Visit: Payer: Self-pay

## 2022-07-20 ENCOUNTER — Encounter: Payer: Self-pay | Admitting: General Practice

## 2022-07-20 ENCOUNTER — Encounter: Payer: Self-pay | Admitting: Obstetrics and Gynecology

## 2022-07-20 ENCOUNTER — Ambulatory Visit: Payer: Medicaid Other

## 2022-07-20 ENCOUNTER — Inpatient Hospital Stay
Admission: EM | Admit: 2022-07-20 | Discharge: 2022-07-21 | DRG: 807 | Disposition: A | Discharge status: Home / Self Care | Payer: Medicaid Other | Attending: Obstetrics and Gynecology | Admitting: Obstetrics and Gynecology

## 2022-07-20 DIAGNOSIS — Z3A38 38 weeks gestation of pregnancy: Secondary | ICD-10-CM | POA: Diagnosis not present

## 2022-07-20 DIAGNOSIS — O24424 Gestational diabetes mellitus in childbirth, insulin controlled: Secondary | ICD-10-CM | POA: Diagnosis present

## 2022-07-20 DIAGNOSIS — D509 Iron deficiency anemia, unspecified: Secondary | ICD-10-CM | POA: Diagnosis present

## 2022-07-20 DIAGNOSIS — Z8249 Family history of ischemic heart disease and other diseases of the circulatory system: Secondary | ICD-10-CM | POA: Diagnosis not present

## 2022-07-20 DIAGNOSIS — O3663X0 Maternal care for excessive fetal growth, third trimester, not applicable or unspecified: Secondary | ICD-10-CM | POA: Diagnosis present

## 2022-07-20 DIAGNOSIS — O24420 Gestational diabetes mellitus in childbirth, diet controlled: Secondary | ICD-10-CM | POA: Diagnosis present

## 2022-07-20 DIAGNOSIS — O9902 Anemia complicating childbirth: Secondary | ICD-10-CM | POA: Diagnosis present

## 2022-07-20 DIAGNOSIS — Z01818 Encounter for other preprocedural examination: Secondary | ICD-10-CM

## 2022-07-20 DIAGNOSIS — Z23 Encounter for immunization: Secondary | ICD-10-CM

## 2022-07-20 DIAGNOSIS — O24419 Gestational diabetes mellitus in pregnancy, unspecified control: Principal | ICD-10-CM | POA: Diagnosis present

## 2022-07-20 LAB — BASIC METABOLIC PANEL
Anion gap: 8 (ref 5–15)
BUN: 9 mg/dL (ref 6–20)
CO2: 18 mmol/L — ABNORMAL LOW (ref 22–32)
Calcium: 8.6 mg/dL — ABNORMAL LOW (ref 8.9–10.3)
Chloride: 110 mmol/L (ref 98–111)
Creatinine, Ser: 0.38 mg/dL — ABNORMAL LOW (ref 0.44–1.00)
GFR, Estimated: >60 mL/min (ref >60)
Glucose, Bld: 94 mg/dL (ref 70–99)
Potassium: 3.7 mmol/L (ref 3.5–5.1)
Sodium: 136 mmol/L (ref 135–145)

## 2022-07-20 LAB — RPR: RPR Ser Ql: NONREACTIVE

## 2022-07-20 LAB — CBC
HCT: 30.7 % — ABNORMAL LOW (ref 36.0–46.0)
Hemoglobin: 10.5 g/dL — ABNORMAL LOW (ref 12.0–15.0)
MCH: 30.3 pg (ref 26.0–34.0)
MCHC: 34.2 g/dL (ref 30.0–36.0)
MCV: 88.7 fL (ref 80.0–100.0)
Platelets: 202 10*3/uL (ref 150–400)
RBC: 3.46 MIL/uL — ABNORMAL LOW (ref 3.87–5.11)
RDW: 14.6 % (ref 11.5–15.5)
WBC: 7.5 10*3/uL (ref 4.0–10.5)
nRBC: 0 % (ref 0.0–0.2)

## 2022-07-20 LAB — GLUCOSE, CAPILLARY
Glucose-Capillary: 68 mg/dL — ABNORMAL LOW (ref 70–99)
Glucose-Capillary: 79 mg/dL (ref 70–99)
Glucose-Capillary: 91 mg/dL (ref 70–99)

## 2022-07-20 LAB — TYPE AND SCREEN
ABO/RH(D): A POS
Antibody Screen: NEGATIVE

## 2022-07-20 MED ORDER — FENTANYL CITRATE (PF) 100 MCG/2ML IJ SOLN
INTRAMUSCULAR | Status: AC
  Filled 2022-07-20: qty 2

## 2022-07-20 MED ORDER — TERBUTALINE SULFATE 1 MG/ML IJ SOLN: 0.2500 mg | Freq: Once | INTRAMUSCULAR | Status: DC | PRN

## 2022-07-20 MED ORDER — MISOPROSTOL 25 MCG QUARTER TABLET
25.0000 ug | ORAL_TABLET | Freq: Once | ORAL | Status: AC
  Administered 2022-07-20: 25 ug via ORAL
  Filled 2022-07-20: qty 1

## 2022-07-20 MED ORDER — MISOPROSTOL 25 MCG QUARTER TABLET
25.0000 ug | ORAL_TABLET | ORAL | Status: DC | PRN
  Filled 2022-07-20: qty 1

## 2022-07-20 MED ORDER — PRENATAL MULTIVITAMIN CH
1.0000 | ORAL_TABLET | Freq: Every day | ORAL | Status: DC
  Administered 2022-07-21: 1 via ORAL
  Filled 2022-07-20: qty 1

## 2022-07-20 MED ORDER — METHYLERGONOVINE MALEATE 0.2 MG/ML IJ SOLN
INTRAMUSCULAR | Status: AC
  Filled 2022-07-20: qty 1

## 2022-07-20 MED ORDER — COCONUT OIL OIL: 1.0000 | TOPICAL_OIL | Status: DC | PRN

## 2022-07-20 MED ORDER — LIDOCAINE HCL (PF) 1 % IJ SOLN
30.0000 mL | INTRAMUSCULAR | Status: AC | PRN
  Administered 2022-07-20: 30 mL via SUBCUTANEOUS

## 2022-07-20 MED ORDER — METHYLERGONOVINE MALEATE 0.2 MG/ML IJ SOLN
0.2000 mg | Freq: Once | INTRAMUSCULAR | Status: AC
  Administered 2022-07-20: 0.2 mg via INTRAMUSCULAR

## 2022-07-20 MED ORDER — OXYCODONE-ACETAMINOPHEN 5-325 MG PO TABS: 2.0000 | ORAL_TABLET | ORAL | Status: DC | PRN

## 2022-07-20 MED ORDER — MISOPROSTOL 25 MCG QUARTER TABLET: 50.0000 ug | ORAL_TABLET | Freq: Once | ORAL | Status: DC

## 2022-07-20 MED ORDER — FENTANYL CITRATE (PF) 100 MCG/2ML IJ SOLN
50.0000 ug | INTRAMUSCULAR | Status: DC | PRN
  Administered 2022-07-20: 100 ug via INTRAVENOUS

## 2022-07-20 MED ORDER — ONDANSETRON HCL 4 MG/2ML IJ SOLN: 4.0000 mg | INTRAMUSCULAR | Status: DC | PRN

## 2022-07-20 MED ORDER — PHENYLEPHRINE 80 MCG/ML (10ML) SYRINGE FOR IV PUSH (FOR BLOOD PRESSURE SUPPORT): 80.0000 ug | PREFILLED_SYRINGE | INTRAVENOUS | Status: DC | PRN

## 2022-07-20 MED ORDER — OXYTOCIN-SODIUM CHLORIDE 30-0.9 UT/500ML-% IV SOLN
1.0000 m[IU]/min | INTRAVENOUS | Status: DC
  Administered 2022-07-20: 4 m[IU]/min via INTRAVENOUS
  Filled 2022-07-20: qty 500

## 2022-07-20 MED ORDER — MISOPROSTOL 25 MCG QUARTER TABLET
25.0000 ug | ORAL_TABLET | Freq: Once | ORAL | Status: AC
  Administered 2022-07-20: 25 ug via ORAL

## 2022-07-20 MED ORDER — DIPHENHYDRAMINE HCL 25 MG PO CAPS: 25.0000 mg | ORAL_CAPSULE | Freq: Four times a day (QID) | ORAL | Status: DC | PRN

## 2022-07-20 MED ORDER — DIBUCAINE (PERIANAL) 1 % EX OINT: 1.0000 | TOPICAL_OINTMENT | CUTANEOUS | Status: DC | PRN

## 2022-07-20 MED ORDER — MISOPROSTOL 25 MCG QUARTER TABLET
ORAL_TABLET | ORAL | Status: AC
  Administered 2022-07-20: 25 ug via VAGINAL
  Filled 2022-07-20: qty 1

## 2022-07-20 MED ORDER — ACETAMINOPHEN 325 MG PO TABS
650.0000 mg | ORAL_TABLET | ORAL | Status: DC | PRN
  Filled 2022-07-20: qty 2

## 2022-07-20 MED ORDER — FERROUS SULFATE 325 (65 FE) MG PO TABS
325.0000 mg | ORAL_TABLET | Freq: Two times a day (BID) | ORAL | Status: DC
  Administered 2022-07-20 – 2022-07-21 (×2): 325 mg via ORAL
  Filled 2022-07-20 (×2): qty 1

## 2022-07-20 MED ORDER — MAGNESIUM HYDROXIDE 400 MG/5ML PO SUSP
30.0000 mL | ORAL | Status: DC | PRN
  Filled 2022-07-20: qty 30

## 2022-07-20 MED ORDER — ACETAMINOPHEN 325 MG PO TABS
650.0000 mg | ORAL_TABLET | ORAL | Status: DC | PRN
  Administered 2022-07-20: 650 mg via ORAL

## 2022-07-20 MED ORDER — LACTATED RINGERS IV SOLN: INTRAVENOUS | Status: DC

## 2022-07-20 MED ORDER — LACTATED RINGERS IV SOLN: 500.0000 mL | INTRAVENOUS | Status: DC | PRN

## 2022-07-20 MED ORDER — ONDANSETRON HCL 4 MG PO TABS: 4.0000 mg | ORAL_TABLET | ORAL | Status: DC | PRN

## 2022-07-20 MED ORDER — ZOLPIDEM TARTRATE 5 MG PO TABS: 5.0000 mg | ORAL_TABLET | Freq: Every evening | ORAL | Status: DC | PRN

## 2022-07-20 MED ORDER — OXYTOCIN BOLUS FROM INFUSION: 333.0000 mL | Freq: Once | INTRAVENOUS | Status: DC

## 2022-07-20 MED ORDER — OXYTOCIN-SODIUM CHLORIDE 30-0.9 UT/500ML-% IV SOLN
2.5000 [IU]/h | INTRAVENOUS | Status: DC
  Administered 2022-07-20: 2.5 [IU]/h via INTRAVENOUS

## 2022-07-20 MED ORDER — LACTATED RINGERS IV SOLN: 500.0000 mL | Freq: Once | INTRAVENOUS | Status: DC

## 2022-07-20 MED ORDER — IBUPROFEN 600 MG PO TABS
600.0000 mg | ORAL_TABLET | Freq: Four times a day (QID) | ORAL | Status: DC
  Administered 2022-07-20 – 2022-07-21 (×4): 600 mg via ORAL
  Filled 2022-07-20 (×4): qty 1

## 2022-07-20 MED ORDER — OXYTOCIN-SODIUM CHLORIDE 30-0.9 UT/500ML-% IV SOLN
INTRAVENOUS | Status: AC
  Filled 2022-07-20: qty 500

## 2022-07-20 MED ORDER — MISOPROSTOL 25 MCG QUARTER TABLET
25.0000 ug | ORAL_TABLET | Freq: Once | ORAL | Status: AC
  Administered 2022-07-20: 25 ug via VAGINAL
  Filled 2022-07-20: qty 1

## 2022-07-20 MED ORDER — FENTANYL-BUPIVACAINE-NACL 0.5-0.125-0.9 MG/250ML-% EP SOLN: 12.0000 mL/h | EPIDURAL | Status: DC | PRN

## 2022-07-20 MED ORDER — SENNOSIDES-DOCUSATE SODIUM 8.6-50 MG PO TABS
2.0000 | ORAL_TABLET | ORAL | Status: DC
  Administered 2022-07-21: 2 via ORAL
  Filled 2022-07-20: qty 2

## 2022-07-20 MED ORDER — EPHEDRINE 5 MG/ML INJ: 10.0000 mg | INTRAVENOUS | Status: DC | PRN

## 2022-07-20 MED ORDER — SOD CITRATE-CITRIC ACID 500-334 MG/5ML PO SOLN: 30.0000 mL | ORAL | Status: DC | PRN

## 2022-07-20 MED ORDER — OXYTOCIN 10 UNIT/ML IJ SOLN
INTRAMUSCULAR | Status: AC
  Administered 2022-07-20: 10 [IU]
  Filled 2022-07-20: qty 2

## 2022-07-20 MED ORDER — FENTANYL-BUPIVACAINE-NACL 0.5-0.125-0.9 MG/250ML-% EP SOLN
EPIDURAL | Status: AC
  Filled 2022-07-20: qty 250

## 2022-07-20 MED ORDER — LIDOCAINE HCL (PF) 1 % IJ SOLN
INTRAMUSCULAR | Status: AC
  Filled 2022-07-20: qty 30

## 2022-07-20 MED ORDER — OXYTOCIN 10 UNIT/ML IJ SOLN: 10.0000 [IU] | Freq: Once | INTRAMUSCULAR | Status: AC

## 2022-07-20 MED ORDER — BENZOCAINE-MENTHOL 20-0.5 % EX AERO
1.0000 | INHALATION_SPRAY | CUTANEOUS | Status: DC | PRN
  Administered 2022-07-20: 1 via TOPICAL
  Filled 2022-07-20: qty 56

## 2022-07-20 MED ORDER — DIPHENHYDRAMINE HCL 50 MG/ML IJ SOLN: 12.5000 mg | INTRAMUSCULAR | Status: DC | PRN

## 2022-07-20 MED ORDER — MEASLES, MUMPS & RUBELLA VAC IJ SOLR
0.5000 mL | Freq: Once | INTRAMUSCULAR | Status: AC
  Administered 2022-07-21: 0.5 mL via SUBCUTANEOUS
  Filled 2022-07-20 (×2): qty 0.5

## 2022-07-20 MED ORDER — SIMETHICONE 80 MG PO CHEW: 80.0000 mg | CHEWABLE_TABLET | ORAL | Status: DC | PRN

## 2022-07-20 MED ORDER — OXYCODONE-ACETAMINOPHEN 5-325 MG PO TABS: 1.0000 | ORAL_TABLET | ORAL | Status: DC | PRN

## 2022-07-20 MED ORDER — WITCH HAZEL-GLYCERIN EX PADS
1.0000 | MEDICATED_PAD | CUTANEOUS | Status: DC | PRN
  Administered 2022-07-20: 1 via TOPICAL
  Filled 2022-07-20: qty 100

## 2022-07-20 MED ORDER — AMMONIA AROMATIC IN INHA
RESPIRATORY_TRACT | Status: AC
  Filled 2022-07-20: qty 10

## 2022-07-20 MED ORDER — ONDANSETRON HCL 4 MG/2ML IJ SOLN: 4.0000 mg | Freq: Four times a day (QID) | INTRAMUSCULAR | Status: DC | PRN

## 2022-07-20 MED ORDER — MISOPROSTOL 200 MCG PO TABS
ORAL_TABLET | ORAL | Status: AC
  Filled 2022-07-20: qty 4

## 2022-07-20 NOTE — Inpatient Diabetes Management (Signed)
Inpatient Diabetes Program Recommendations  Diabetes Treatment Program Recommendations  ADA Standards of Care Diabetes in Pregnancy Target Glucose Ranges:  Fasting: 70 - 95 mg/dL 1 hr postprandial: Less than 140mg /dL (from first bite of meal) 2 hr postprandial: Less than 120 mg/dL (from first bite of meal)    Lab Results  Component Value Date   GLUCAP 68 (L) 07/20/2022    Review of Glycemic Control  Latest Reference Range & Units 07/20/22 00:46 07/20/22 05:47 07/20/22 09:48  Glucose-Capillary 70 - 99 mg/dL 91 79 68 (L)  (L): Data is abnormally low Diabetes history: GDM Outpatient Diabetes medications: none Current orders for Inpatient glycemic control: none  Inpatient Diabetes Program Recommendations:    Consider adding CBGs Q4H while in labor.   Thanks, Lujean Rave, MSN, RNC-OB Diabetes Coordinator [phone] (8a-5p)

## 2022-07-20 NOTE — Discharge Summary (Signed)
Obstetrical Discharge Summary  Patient Name: Lindsey Deleon DOB: March 10, 1990 MRN: [id number]  Date of Admission: 07/20/2022 Date of Delivery: 07/20/22 Delivered by: Beverly Gust MD Date of Discharge: 07/21/2022  Primary OB: [facility] LMP:No LMP recorded. EDC Estimated Date of Delivery: 08/02/22 Gestational Age at Delivery: [redacted]w[redacted]d   Antepartum complications: A2GDM - insulin  Admitting Diagnosis: LGA at 38 weeks  Secondary Diagnosis: Patient Active Problem List   Diagnosis Date Noted   Gestational diabetes 07/20/2022   Vaginal discharge during pregnancy in third trimester 06/18/2022   Obesity BMI=33.7 03/24/2021   Family history stroke (brain) x2 in 1/2 sister at age 39 03/24/2021   Abnormal Pap smear of cervix 11/25/16 LGSIL with cells suspicious for HGSIL 03/24/2021    Augmentation: AROM and Cytotec Complications: None Intrapartum complications/course:  Date of Delivery:  Delivered By: Beverly Gust MD Delivery Type: spontaneous vaginal delivery Anesthesia: none Placenta: spontaneous Laceration: 2nd degree Episiotomy: none Newborn Data: Live born female  Birth Weight: 9 lb 8.4 oz (4320 g) APGAR: 8, 9  Newborn Delivery   Birth date/time: 07/20/2022 13:09:00 Delivery type: Vaginal, Spontaneous     Postpartum Procedures: None  Edinburgh:     07/20/2022   10:00 PM  Edinburgh Postnatal Depression Scale Screening Tool  I have been able to laugh and see the funny side of things. 0  I have looked forward with enjoyment to things. 0  I have blamed myself unnecessarily when things went wrong. 2  I have been anxious or worried for no good reason. 2  I have felt scared or panicky for no good reason. 2  Things have been getting on top of me. 1  I have been so unhappy that I have had difficulty sleeping. 0  I have felt sad or miserable. 1  I have been so unhappy that I have been crying. 1  The thought of harming myself has occurred to me. 0  Edinburgh  Postnatal Depression Scale Total 9    Post partum course:  Patient had an uncomplicated postpartum course.  By time of discharge on PPD#1, her pain was controlled on oral pain medications; she had appropriate lochia and was ambulating, voiding without difficulty and tolerating regular diet.  She was deemed stable for discharge to home.    Discharge Physical Exam:  BP 120/73 (BP Location: Left Arm)   Pulse (!) 59   Temp 97.9 F (36.6 C) (Oral)   Resp 18   Ht 5\' 5"  (1.651 m)   Wt 93.4 kg   SpO2 100%   Breastfeeding Unknown   BMI 34.28 kg/m   General: NAD CV: RRR Pulm: CTABL, nl effort ABD: s/nd/nt, fundus firm and below the umbilicus Lochia: moderate Incision: c/d/i DVT Evaluation: LE non-ttp, no evidence of DVT on exam.  Hemoglobin  Date Value Ref Range Status  07/21/2022 10.1 (L) 12.0 - 15.0 g/dL Final   HGB  Date Value Ref Range Status  08/03/2012 13.2 12.0 - 16.0 g/dL Final   HCT  Date Value Ref Range Status  07/21/2022 30.7 (L) 36.0 - 46.0 % Final  08/03/2012 39.1 35.0 - 47.0 % Final     Disposition: stable, discharge to home. Baby Feeding: breastmilk  Baby Disposition: home with mom  Rh Immune globulin given:  Rubella vaccine given:  Tdap vaccine given in AP or PP setting:  Flu vaccine given in AP or PP setting:   Risk assessment for postpartum VTE and prophylactic treatment: Very high risk factors: None High risk factors: None  Moderate risk factors: None and BMI 30-40 kg/m2  Postpartum VTE prophylaxis with LMWH not indicated  Contraception: Abstinence   Prenatal Labs:   Blood type/Rh A POS   Antibody screen Negative    Rubella Non-Immune     Varicella Immune  RPR NR    HBsAg NR   Hep C NR   HIV Neg    GC neg  Chlamydia neg  Genetic screening cfDNA negative   1 hour GTT 164  3 hour GTT [phone]   GBS Neg      Plan:  Lindsey Deleon was discharged to home in good condition. Follow-up appointment with delivering provider in 6  weeks.  Needs 6 week 75 gm glucola test  Discharge Medications:   Allergies as of 07/21/2022   No Known Allergies      Medication List     STOP taking these medications    aluminum-magnesium hydroxide-simethicone 200-200-20 MG/5ML Susp Commonly known as: MAALOX   diphenhydrAMINE 25 mg capsule Commonly known as: BENADRYL   metoCLOPramide 10 MG tablet Commonly known as: REGLAN   norethindrone 0.35 MG tablet Commonly known as: MICRONOR   predniSONE 10 MG (21) Tbpk tablet Commonly known as: STERAPRED UNI-PAK 21 TAB       TAKE these medications    acetaminophen 325 MG tablet Commonly known as: Tylenol Take 2 tablets (650 mg total) by mouth every 4 (four) hours as needed (for pain scale < 4).   escitalopram 10 MG tablet Commonly known as: LEXAPRO Take 10 mg by mouth daily.   ferrous sulfate 325 (65 FE) MG tablet Take 325 mg by mouth daily with breakfast.   ibuprofen 600 MG tablet Commonly known as: ADVIL Take 1 tablet (600 mg total) by mouth every 6 (six) hours as needed for mild pain or cramping.   M-Natal Plus 27-1 MG Tabs TAKE 1 TABLET BY MOUTH EVERY DAY   vitamin C 100 MG tablet Take 100 mg by mouth daily.         Follow-up Information     Schermerhorn, Ihor Austin, MD. Schedule an appointment as soon as possible for a visit in 6 week(s).   Specialty: Obstetrics and Gynecology Why: postpartum visit Contact information: [address] [place] [address] [phone]                 Signed:  Margaretmary Eddy, CNM Certified Nurse Midwife [facility]

## 2022-07-20 NOTE — Progress Notes (Signed)
Lindsey Deleon is a 32 y.o. G2P1001 at [redacted]w[redacted]d Induction for LGA  and A2GDM  2nd dose of cytotec placed  Subjective: Ctx moderate painful .  Objective: BP 126/73   Pulse 63   Temp 98.2 F (36.8 C) (Oral)   Resp 18   Ht 5\' 5"  (1.651 m)   Wt 93.4 kg   BMI 34.28 kg/m  No intake/output data recorded. No intake/output data recorded.  FHT:  FHR: 120 bpm, variability: moderate,  accelerations:  Present,  decelerations:  Absent UC:   regular, every 2-3 minutes SVE:   Dilation: 1 Effacement (%): 50, 60 Station: -2 Exam by:: Julianne Handler, RN  Labs: Lab Results  Component Value Date   WBC 7.5 07/20/2022   HGB 10.5 (L) 07/20/2022   HCT 30.7 (L) 07/20/2022   MCV 88.7 07/20/2022   PLT 202 07/20/2022    Assessment / Plan: Induction for LGA , proven pelvic 9+ Glucose values good  CAt 1 monitroing   I will recheck 1000 and possible arom  Suzy Bouchard, MD 07/20/2022, 7:25 AM

## 2022-07-20 NOTE — H&P (Signed)
OB History & Physical   History of Present Illness:   Chief Complaint: scheduled induction of labor   HPI:  Lindsey Deleon is a 32 y.o. G59P1001 female at [redacted]w[redacted]d, No LMP recorded. Patient is pregnant., consistent with [place] at [redacted]w[redacted]d, with Estimated Date of Delivery: 08/02/22.  She presents to L&D for scheduled IOL for A2GDM, macrosomia.  Reports active fetal movement  Contractions:  irregular contractions since misoprostol given LOF/SROM: denies  Vaginal bleeding: denies   Factors complicating pregnancy:  A2GDM - controlled with insulin Unstable lie - vertex by [place]  Macrosomia  Polyhydramnios - resolved  History of 3rd degree laceration  Maternal iron deficiency anemia   Patient Active Problem List   Diagnosis Date Noted   Gestational diabetes 07/20/2022   Vaginal discharge during pregnancy in third trimester 06/18/2022   Obesity BMI=33.7 03/24/2021   Family history stroke (brain) x2 in 1/2 sister at age 25 03/24/2021   Abnormal Pap smear of cervix 11/25/16 LGSIL with cells suspicious for HGSIL 03/24/2021    Prenatal Transfer Tool  Maternal Diabetes: Yes:  Diabetes Type:  Insulin/Medication controlled Genetic Screening: Normal Maternal Ultrasounds/Referrals: Normal Fetal Ultrasounds or other Referrals:  None Maternal Substance Abuse:  No Significant Maternal Medications:  Meds include: Other: Insulin Significant Maternal Lab Results: Group B Strep negative  Maternal Medical History:   Past Medical History:  Diagnosis Date   Anxiety    Asthma    childhood asthma   History of cyst of urethra 09/30/2016   current cyst    Past Surgical History:  Procedure Laterality Date   WISDOM TOOTH EXTRACTION      No Known Allergies  Prior to Admission medications   Medication Sig Start Date End Date Taking? Authorizing Provider  aluminum-magnesium hydroxide-simethicone (MAALOX) 200-200-20 MG/5ML SUSP Take 30 mLs by mouth 4 (four) times daily -  before meals and at bedtime. Patient  not taking: Reported on 05/17/2022 03/06/22   Sharman Cheek, MD  Ascorbic Acid (VITAMIN C) 100 MG tablet Take 100 mg by mouth daily.    [provider]  diphenhydrAMINE (BENADRYL) 25 mg capsule Take 2 capsules (50 mg total) by mouth every 6 (six) hours as needed (nausea). Patient not taking: Reported on 05/17/2022 03/06/22   Sharman Cheek, MD  escitalopram (LEXAPRO) 10 MG tablet Take 10 mg by mouth daily.    [provider]  ferrous sulfate 325 (65 FE) MG tablet Take 325 mg by mouth daily with breakfast.    [provider]  metoCLOPramide (REGLAN) 10 MG tablet Take 1 tablet (10 mg total) by mouth every 6 (six) hours as needed for nausea or vomiting. Patient not taking: Reported on 05/17/2022 03/06/22   Sharman Cheek, MD  norethindrone (MICRONOR) 0.35 MG tablet Take 1 tablet (0.35 mg total) by mouth daily. Patient not taking: Reported on 05/17/2022 03/24/21   Alberteen Spindle, CNM  predniSONE (STERAPRED UNI-PAK 21 TAB) 10 MG (21) TBPK tablet 6 tablets on day 1, then 5 tablets on day 2, then 4 tablets on day 3, then 3 tablets on day 4, then 2 tablets on day 5, then 1 tablet on day 6. Patient not taking: Reported on 06/18/2022 06/15/20   Sharman Cheek, MD  Prenatal Vit-Fe Fumarate-FA (M-NATAL PLUS) 27-1 MG TABS TAKE 1 TABLET BY MOUTH EVERY DAY 07/05/22   Deleon, Lindsey Bastos, MD  fluticasone (FLONASE) 50 MCG/ACT nasal spray Place 2 sprays into both nostrils daily. 10/22/19 02/03/20  Moshe Cipro, NP     Prenatal care site:  [facility]  OB History  Gravida Para Term Preterm AB Living  2 1 1  0 0 1  SAB IAB Ectopic Multiple Live Births  0 0 0 0 1    # Outcome Date GA Lbr Len/2nd Weight Sex Delivery Anes PTL Lv  2 Current           1 Term 10/13/16 [redacted]w[redacted]d / 01:00 4160 g M Vag-Spont EPI  LIV     Name: Lindsey Deleon     Apgar1: 8  Apgar5: 9     Social History: She  reports that she has never smoked. She has never been exposed to tobacco smoke.  She has never used smokeless tobacco. She reports that she does not currently use alcohol after a past usage of about 1.0 standard drink of alcohol per week. She reports that she does not currently use drugs after having used the following drugs: Marijuana.  Family History: family history includes Alcohol abuse in her father; Breast cancer in her cousin and paternal aunt; COPD in her father; Diabetes in her maternal grandmother; Emphysema in her father; Hypertension in her maternal grandmother and mother; Kidney failure in her maternal grandmother; Liver disease in her mother; Lung cancer in her father; Stroke in her maternal grandmother and sister.   Review of Systems: A full review of systems was performed and negative except as noted in the HPI.     Physical Exam:  Vital Signs: BP 118/69   Pulse 68   Temp 98.7 F (37.1 C) (Axillary)   Resp 18   Ht 5\' 5"  (1.651 m)   Wt 93.4 kg   BMI 34.28 kg/m   General: no acute distress.  HEENT: normocephalic, atraumatic Lungs: normal respiratory effort Abdomen: soft, gravid, non-tender;  EFW: 9-9 1/2 lbs  Pelvic:   External: Normal external female genitalia  Cervix: Dilation: 1 / Effacement (%): 50, 60 / Station: -2    Extremities: non-tender, symmetric, no edema bilaterally.  DTRs: 2+/2+  Neurologic: Alert & oriented x 3.    Results for orders placed or performed during the hospital encounter of 07/20/22 (from the past 24 hour(s))  Glucose, capillary     Status: None   Collection Time: 07/20/22 12:46 AM  Result Value Ref Range   Glucose-Capillary 91 70 - 99 mg/dL  CBC     Status: Abnormal   Collection Time: 07/20/22 12:55 AM  Result Value Ref Range   WBC 7.5 4.0 - 10.5 K/uL   RBC 3.46 (L) 3.87 - 5.11 MIL/uL   Hemoglobin 10.5 (L) 12.0 - 15.0 g/dL   HCT 16.1 (L) 09.6 - 04.5 %   MCV 88.7 80.0 - 100.0 fL   MCH 30.3 26.0 - 34.0 pg   MCHC 34.2 30.0 - 36.0 g/dL   RDW 40.9 81.1 - 91.4 %   Platelets 202 150 - 400 K/uL   nRBC 0.0 0.0 - 0.2 %   Type and screen     Status: None (Preliminary result)   Collection Time: 07/20/22 12:55 AM  Result Value Ref Range   ABO/RH(D) PENDING    Antibody Screen PENDING    Sample Expiration      07/23/2022,2359 Performed at [facility], [address] [place], [place] [address]   Basic metabolic panel     Status: Abnormal   Collection Time: 07/20/22 12:55 AM  Result Value Ref Range   Sodium 136 135 - 145 mmol/L   Potassium 3.7 3.5 - 5.1 mmol/L   Chloride 110 98 -  111 mmol/L   CO2 18 (L) 22 - 32 mmol/L   Glucose, Bld 94 70 - 99 mg/dL   BUN 9 6 - 20 mg/dL   Creatinine, Ser 1.61 (L) 0.44 - 1.00 mg/dL   Calcium 8.6 (L) 8.9 - 10.3 mg/dL   GFR, Estimated >09 >60 mL/min   Anion gap 8 5 - 15  Type and screen     Status: None   Collection Time: 07/20/22  1:35 AM  Result Value Ref Range   ABO/RH(D) A POS    Antibody Screen NEG    Sample Expiration      07/23/2022,2359 Performed at [facility], [address] [place], [place] [address]   Glucose, capillary     Status: None   Collection Time: 07/20/22  5:47 AM  Result Value Ref Range   Glucose-Capillary 79 70 - 99 mg/dL    Pertinent Results:  Prenatal Labs: Blood type/Rh A POS   Antibody screen Negative    Rubella Non-Immune     Varicella Immune  RPR NR    HBsAg NR   Hep C NR   HIV Neg    GC neg  Chlamydia neg  Genetic screening cfDNA negative   1 hour GTT 164  3 hour GTT [phone]   GBS Neg     FHT:  FHR: 120 bpm, variability: moderate,  accelerations:  Present,  decelerations:  Absent Category/reactivity:  Category I UC:   regular, every 2-4 minutes since misoprostol given   Cephalic by [place] and SVE   No results found.  Assessment:  VALESHA SKOTNICKI is a 32 y.o. G77P1001 female at [redacted]w[redacted]d with A2GDM and macrosomia.   Plan:  1. Admit to Labor & Delivery - consents reviewed and obtained - Dr. Feliberto Gottron notified of admission and plan of care   2. Fetal Well being  - Fetal Tracing: cat  I - Group B Streptococcus ppx not indicated: GBS negative - Presentation: cephalic confirmed by SVE   3. Routine OB: - Prenatal labs reviewed, as above - Rh positive - CBC, T&S, RPR on admit - Clear liquid diet , continuous IV fluids  4. Induction of labor  - Contractions monitored with external toco - Pelvis proven to 4160 grams, adequate for trial of labor  - Plan for induction with misoprostol  - Induction with oxytocin and AROM as appropriate  - Plan for  continuous fetal monitoring - Maternal pain control as desired - Anticipate vaginal delivery  5. Post Partum Planning: - Infant feeding: breast feeding - Contraception:  TBD - Flu vaccine: Given prenatally - Tdap vaccine: Given prenatally - RSV vaccine:  Not indicated   Gustavo Lah, [place] 07/20/22 6:45 AM  Margaretmary Eddy, CNM Certified Nurse Midwife [facility]

## 2022-07-21 LAB — CBC
HCT: 30.7 % — ABNORMAL LOW (ref 36.0–46.0)
Hemoglobin: 10.1 g/dL — ABNORMAL LOW (ref 12.0–15.0)
MCH: 29.6 pg (ref 26.0–34.0)
MCHC: 32.9 g/dL (ref 30.0–36.0)
MCV: 90 fL (ref 80.0–100.0)
Platelets: 175 10*3/uL (ref 150–400)
RBC: 3.41 MIL/uL — ABNORMAL LOW (ref 3.87–5.11)
RDW: 14.6 % (ref 11.5–15.5)
WBC: 8.7 10*3/uL (ref 4.0–10.5)
nRBC: 0 % (ref 0.0–0.2)

## 2022-07-21 MED ORDER — IBUPROFEN 600 MG PO TABS: 600.0000 mg | ORAL_TABLET | Freq: Four times a day (QID) | ORAL | 0 refills | Status: AC | PRN

## 2022-07-21 MED ORDER — ACETAMINOPHEN 325 MG PO TABS: 650.0000 mg | ORAL_TABLET | ORAL | Status: AC | PRN

## 2022-07-21 NOTE — Progress Notes (Signed)
Postpartum Day  1  Subjective: no complaints, up ad lib, voiding, and tolerating PO  Doing well, no concerns. Ambulating without difficulty, pain managed with PO meds, tolerating regular diet, and voiding without difficulty.   No fever/chills, chest pain, shortness of breath, nausea/vomiting, or leg pain. No nipple or breast pain. No headache, visual changes, or RUQ/epigastric pain.  Objective: BP 120/73 (BP Location: Left Arm)   Pulse (!) 59   Temp 97.9 F (36.6 C) (Oral)   Resp 18   Ht 5\' 5"  (1.651 m)   Wt 93.4 kg   SpO2 100%   Breastfeeding Unknown   BMI 34.28 kg/m    Physical Exam:  General: alert, cooperative, and no distress Breasts: soft/nontender CV: RRR Pulm: nl effort, CTABL Abdomen: soft, non-tender, active bowel sounds Uterine Fundus: firm Perineum: minimal edema, repair well approximated Lochia: appropriate DVT Evaluation: No evidence of DVT seen on physical exam.  Recent Labs    07/20/22 0055 07/21/22 0542  HGB 10.5* 10.1*  HCT 30.7* 30.7*  WBC 7.5 8.7  PLT 202 175    Assessment/Plan: 32 y.o. [id number] postpartum day # 1  -Continue routine postpartum care -Lactation consult PRN for breastfeeding  -Discussed contraceptive options including implant, IUDs hormonal and non-hormonal, injection, pills/ring/patch, condoms, and NFP.  -Maternal iron deficiency anemia in pregnancy - hemodynamically stable and asymptomatic; start PO ferrous sulfate BID with stool softeners  -Immunization status:   needs MMR prior to discharge    Disposition: Continue inpatient postpartum care. Desires discharge home today   LOS: 1 day   Gustavo Lah, [place] 07/21/2022, 9:25 AM   ----- Margaretmary Eddy  Certified Nurse Midwife [facility]

## 2022-07-21 NOTE — Progress Notes (Signed)
Patient discharged home with family.  Discharge instructions, when to follow up, and prescriptions reviewed with patient.  Patient verbalized understanding. Patient will be escorted out by nursing .

## 2022-07-21 NOTE — Lactation Note (Signed)
This note was copied from a baby's chart. Lactation Consultation Note  Patient Name: Lindsey Deleon [id number] Date: 07/21/2022 Age:31 hours Reason for consult: Initial assessment;Term;Breastfeeding assistance Lactation Rounds: LC to the room for a visit. Mother and baby are about to feed. Mother states baby last fed about 5-6 hrs ago but has been asleep and last fed with 55ml's of formula. Care RN also states baby was spitty this am.  Mother states baby has been latching but she is not sure if she has milk. LC reviewed and encouraged feeding on demand and with cues. If baby is not cueing we encourage hand expression to wake baby. Reviewed diaper counts for days of life and when to call Peds with questions. Reviewed "understanding Postpartum and Newborn care " booklet at bedside. Reviewed outpatient Lactation number and resources. Baby was latched in cross cradle on the right. Baby latched easily on the right but is sleepy at the breast. She will suckle with stimulation and has swallows noted. Discussed baby is still in her first 57 HOL, but if she continues to feed like this where she is so sleepy Mother will need to begin pumping and supplement and seek outpatient appt. Reviewed pacifier, pumping, and bottles are not encouraged until breastfeeding is established and going well in the first 4 weeks. Motehr stated understanding with all teaching.    Maternal Data Has patient been taught Hand Expression?: Yes Does the patient have breastfeeding experience prior to this delivery?: Yes How long did the patient breastfeed?: BF for 4 months, wants to BF longer this time  Feeding Mother's Current Feeding Choice: Breast Milk and Formula  LATCH Score Latch: Repeated attempts needed to sustain latch, nipple held in mouth throughout feeding, stimulation needed to elicit sucking reflex.  Audible Swallowing: A few with stimulation  Type of Nipple: Everted at rest and after stimulation  Comfort  (Breast/Nipple): Soft / non-tender  Hold (Positioning): Assistance needed to correctly position infant at breast and maintain latch.  LATCH Score: 7   Interventions Interventions: Breast feeding basics reviewed;Hand express;Breast compression;Education  Discharge Discharge Education: Engorgement and breast care;Warning signs for feeding baby;Outpatient recommendation Pump: DEBP Dia Sitter baby) WIC Program: Yes  Consult Status Consult Status: PRN    Tramain Gershman D Rozanna Cormany 07/21/2022, 10:57 AM

## 2022-07-21 NOTE — Discharge Instructions (Signed)
Vaginal Delivery, Care After Refer to this sheet in the next few weeks. These discharge instructions provide you with information on caring for yourself after delivery. Your caregiver may also give you specific instructions. Your treatment has been planned according to the most current medical practices available, but problems sometimes occur. Call your caregiver if you have any problems or questions after you go home. HOME CARE INSTRUCTIONS Take over-the-counter or prescription medicines only as directed by your caregiver or pharmacist. Do not drink alcohol, especially if you are breastfeeding or taking medicine to relieve pain. Do not smoke tobacco. Continue to use good perineal care. Good perineal care includes: Wiping your perineum from back to front Keeping your perineum clean. You can do sitz baths twice a day, to help keep this area clean Do not use tampons, douche or have sex until your caregiver says it is okay. Shower only and avoid sitting in submerged water, aside from sitz baths Wear a well-fitting bra that provides breast support. Eat healthy foods. Drink enough fluids to keep your urine clear or pale yellow. Eat high-fiber foods such as whole grain cereals and breads, brown rice, beans, and fresh fruits and vegetables every day. These foods may help prevent or relieve constipation. Avoid constipation with high fiber foods or medications, such as miralax or metamucil Follow your caregiver's recommendations regarding resumption of activities such as climbing stairs, driving, lifting, exercising, or traveling. Talk to your caregiver about resuming sexual activities. Resumption of sexual activities is dependent upon your risk of infection, your rate of healing, and your comfort and desire to resume sexual activity. Try to have someone help you with your household activities and your newborn for at least a few days after you leave the hospital. Rest as much as possible. Try to rest or  take a nap when your newborn is sleeping. Increase your activities gradually. Keep all of your scheduled postpartum appointments. It is very important to keep your scheduled follow-up appointments. At these appointments, your caregiver will be checking to make sure that you are healing physically and emotionally. SEEK MEDICAL CARE IF:  You are passing large clots from your vagina. Save any clots to show your caregiver. You have a foul smelling discharge from your vagina. You have trouble urinating. You are urinating frequently. You have pain when you urinate. You have a change in your bowel movements. You have increasing redness, pain, or swelling near your vaginal incision (episiotomy) or vaginal tear. You have pus draining from your episiotomy or vaginal tear. Your episiotomy or vaginal tear is separating. You have painful, hard, or reddened breasts. You have a severe headache. You have blurred vision or see spots. You feel sad or depressed. You have thoughts of hurting yourself or your newborn. You have questions about your care, the care of your newborn, or medicines. You are dizzy or light-headed. You have a rash. You have nausea or vomiting. You were breastfeeding and have not had a menstrual period within 12 weeks after you stopped breastfeeding. You are not breastfeeding and have not had a menstrual period by the 12th week after delivery. You have a fever. SEEK IMMEDIATE MEDICAL CARE IF:  You have persistent pain. You have chest pain. You have shortness of breath. You faint. You have leg pain. You have stomach pain. Your vaginal bleeding saturates two or more sanitary pads in 1 hour. MAKE SURE YOU:  Understand these instructions. Will watch your condition. Will get help right away if you are not doing well or   get worse. Document Released: 01/22/2000 Document Revised: 06/10/2013 Document Reviewed: 09/21/2011 ExitCare Patient Information 2015 [employer]. This  information is not intended to replace advice given to you by your health care provider. Make sure you discuss any questions you have with your health care provider.  Sitz Bath A sitz bath is a warm water bath taken in the sitting position. The water covers only the hips and butt (buttocks). We recommend using one that fits in the toilet, to help with ease of use and cleanliness. It may be used for either healing or cleaning purposes. Sitz baths are also used to relieve pain, itching, or muscle tightening (spasms). The water may contain medicine. Moist heat will help you heal and relax.  HOME CARE  Take 3 to 4 sitz baths a day. Fill the bathtub half-full with warm water. Sit in the water and open the drain a little. Turn on the warm water to keep the tub half-full. Keep the water running constantly. Soak in the water for 15 to 20 minutes. After the sitz bath, pat the affected area dry. GET HELP RIGHT AWAY IF: You get worse instead of better. Stop the sitz baths if you get worse. MAKE SURE YOU: Understand these instructions. Will watch your condition. Will get help right away if you are not doing well or get worse. Document Released: 03/03/2004 Document Revised: 10/19/2011 Document Reviewed: 05/24/2010 ExitCare Patient Information 2015 [employer]. This information is not intended to replace advice given to you by your health care provider. Make sure you discuss any questions you have with your health care provider.   

## 2022-07-21 NOTE — TOC Initial Note (Signed)
Transition of Care [facility]) - Initial/Assessment Note    Patient Details  Name: Lindsey Deleon MRN: [id number] Date of Birth: 1990-09-16  Transition of Care [facility]) CM/SW Contact:    Darolyn Rua, LCSW Phone Number: 07/21/2022, 10:23 AM  Clinical Narrative:                  Patient to dc home today, TOC consult for high depression score.   CSW spoke with patient, patient reports her depression score was high because her grandmother passed away this morning. Reports she is sad as this is circumstantial, reports she does not have PPD but is sad as she wanted her grandmother ot meet her newborn baby. Reports she will be adding her grandmothers name into her newborn girl's middle name.   Patient reports she has no needs, reports hx of having mental health resources given to her by her midwife and states she still has that list if she needs it.  Reports she has car seat and all needs at home for baby, reports no needs at this time RN made aware.   TOC signing off.         Patient Goals and CMS Choice            Expected Discharge Plan and Services                                              Prior Living Arrangements/Services                       Activities of Daily Living Home Assistive Devices/Equipment: None ADL Screening (condition at time of admission) Patient's cognitive ability adequate to safely complete daily activities?: Yes Is the patient deaf or have difficulty hearing?: No Does the patient have difficulty seeing, even when wearing glasses/contacts?: No Does the patient have difficulty concentrating, remembering, or making decisions?: No Patient able to express need for assistance with ADLs?: Yes Does the patient have difficulty dressing or bathing?: No Independently performs ADLs?: Yes (appropriate for developmental age) Does the patient have difficulty walking or climbing stairs?: No Weakness of Legs: None Weakness of Arms/Hands:  None  Permission Sought/Granted                  Emotional Assessment              Admission diagnosis:  Gestational diabetes [O24.419] Patient Active Problem List   Diagnosis Date Noted   Gestational diabetes 07/20/2022   Vaginal discharge during pregnancy in third trimester 06/18/2022   Obesity BMI=33.7 03/24/2021   Family history stroke (brain) x2 in 1/2 sister at age 31 03/24/2021   Abnormal Pap smear of cervix 11/25/16 LGSIL with cells suspicious for HGSIL 03/24/2021   PCP:  [facility]:   [id number] [place], [place] - [address] [place] [address] Phone: [phone] Fax: [phone]     Social Determinants of Health (SDOH) Social History: SDOH Screenings   Food Insecurity: No Food Insecurity (07/20/2022)  Housing: Low Risk  (07/20/2022)  Transportation Needs: No Transportation Needs (07/20/2022)  Utilities: Not At Risk (07/20/2022)  Depression (PHQ2-9): Low Risk  (03/24/2021)  Tobacco Use: Low Risk  (07/20/2022)   SDOH Interventions:     Readmission Risk Interventions     No data to display

## 2022-07-26 ENCOUNTER — Telehealth: Payer: Self-pay

## 2022-07-26 NOTE — Telephone Encounter (Signed)
[facility]- Discharge Call Backs- Spoke to patient on the phone about the following below. 1-Do you have any questions or concerns about yourself as you heal?No.  2-Any concerns or questions about your baby?No.  Repeat hearing screen appt next week. Is your baby eating, peeing,pooping well?Yes 3-WReviewed ABC's of safe sleep. 4-How was your stay at the hospital?Great and quick. 5- Did our team work together to care for you?Yes You should be receiving a survey in the mail soon.   We would really appreciate it if you could fill that out for [place] and return it in the mail.  We value the feedback to make improvements and continue the great work we do.   If you have any questions please feel free to call me back at [phone]

## 2023-02-12 ENCOUNTER — Other Ambulatory Visit: Payer: Self-pay | Admitting: Obstetrics & Gynecology

## 2023-02-27 IMAGING — CR DG CHEST 2V
1 series · 2 of 2 positions shown · non-contrast
Comparison: None.

CLINICAL DATA: Chest pain.

EXAM:
CHEST - 2 VIEW

[Series 1: dg chest 2 view · 0.14mm/px · 2 of 2 slices shown]
[im 1/2]
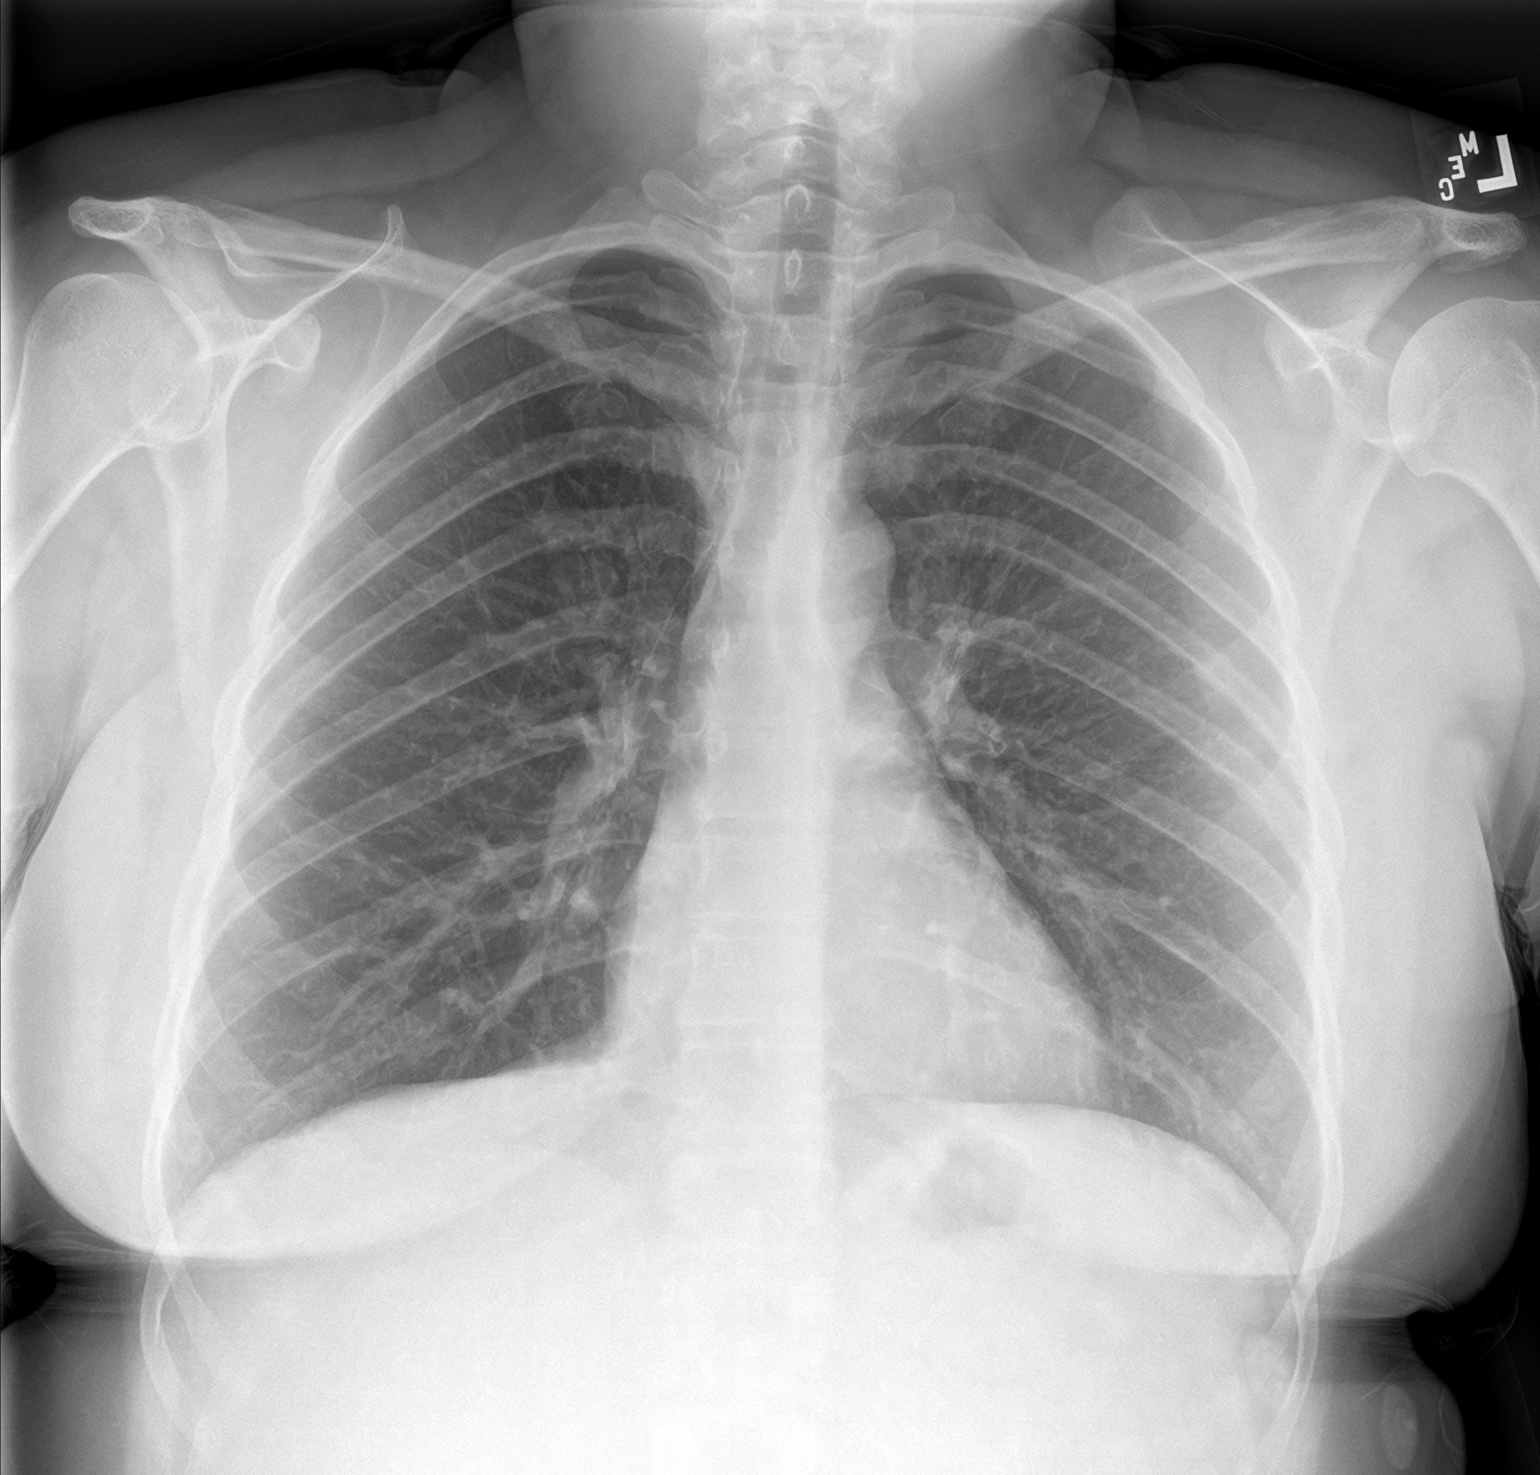
[im 2/2]
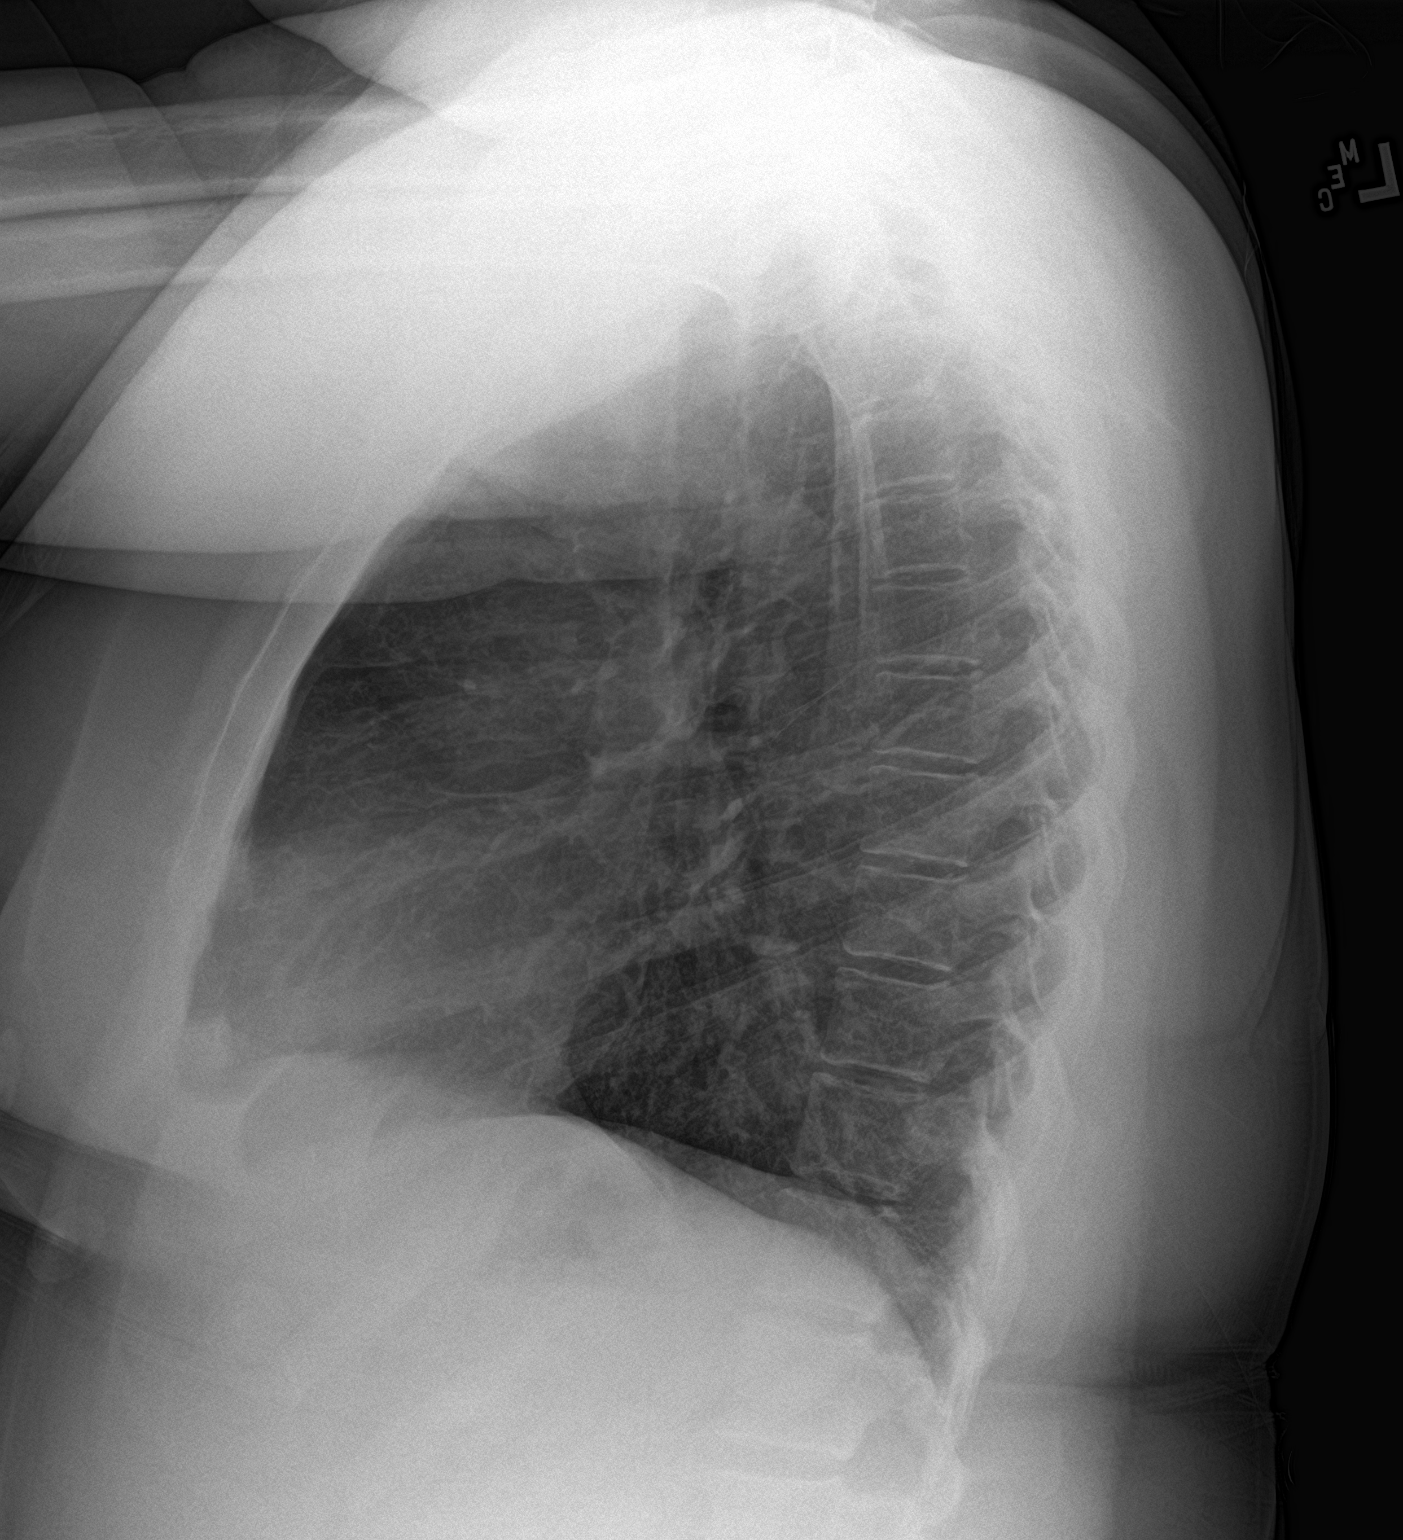

[2 of 2 positions shown; findings below may reference images not displayed]

FINDINGS: The cardiac silhouette, mediastinal and hilar contours are normal.
The lungs are clear. No pleural effusions. No pneumothorax. The bony
thorax is intact.
IMPRESSION: No acute cardiopulmonary findings.
